# Patient Record
Sex: Male | Born: 1970
Health system: Southern US, Community
[De-identification: ages and names within clinical notes are randomized; demographics above are authoritative.]

## PROBLEM LIST (undated history)

## (undated) DIAGNOSIS — Z86010 Personal history of colonic polyps: Secondary | ICD-10-CM

## (undated) DIAGNOSIS — E781 Pure hyperglyceridemia: Secondary | ICD-10-CM

## (undated) DIAGNOSIS — E782 Mixed hyperlipidemia: Secondary | ICD-10-CM

## (undated) DIAGNOSIS — Z860101 Personal history of adenomatous and serrated colon polyps: Secondary | ICD-10-CM

## (undated) DIAGNOSIS — M109 Gout, unspecified: Secondary | ICD-10-CM

## (undated) DIAGNOSIS — E119 Type 2 diabetes mellitus without complications: Secondary | ICD-10-CM

## (undated) HISTORY — DX: Personal history of colonic polyps: Z86.010

## (undated) HISTORY — DX: Mixed hyperlipidemia: E78.2

## (undated) HISTORY — DX: Gout, unspecified: M10.9

## (undated) HISTORY — DX: Pure hyperglyceridemia: E78.1

## (undated) HISTORY — DX: Personal history of adenomatous and serrated colon polyps: Z86.0101

## (undated) HISTORY — PX: NO PAST SURGERIES: SHX2092

## (undated) HISTORY — DX: Type 2 diabetes mellitus without complications: E11.9

---

## 2012-05-25 DIAGNOSIS — E781 Pure hyperglyceridemia: Secondary | ICD-10-CM

## 2012-05-25 HISTORY — DX: Pure hyperglyceridemia: E78.1

## 2015-07-19 ENCOUNTER — Ambulatory Visit: Payer: Self-pay | Admitting: Family Medicine

## 2015-08-01 ENCOUNTER — Ambulatory Visit: Payer: Self-pay | Admitting: Family Medicine

## 2015-12-03 ENCOUNTER — Ambulatory Visit (HOSPITAL_BASED_OUTPATIENT_CLINIC_OR_DEPARTMENT_OTHER)
Admission: RE | Admit: 2015-12-03 | Discharge: 2015-12-03 | Disposition: A | Discharge status: Home / Self Care | Payer: BLUE CROSS/BLUE SHIELD | Source: Ambulatory Visit | Attending: Family Medicine | Admitting: Family Medicine

## 2015-12-03 ENCOUNTER — Ambulatory Visit (INDEPENDENT_AMBULATORY_CARE_PROVIDER_SITE_OTHER): Payer: BLUE CROSS/BLUE SHIELD | Admitting: Family Medicine

## 2015-12-03 ENCOUNTER — Encounter: Payer: Self-pay | Admitting: Family Medicine

## 2015-12-03 VITALS — BP 122/83 | HR 57 | Temp 98.2°F | Resp 16 | Ht 72.5 in | Wt 216.5 lb

## 2015-12-03 DIAGNOSIS — R109 Unspecified abdominal pain: Secondary | ICD-10-CM | POA: Diagnosis not present

## 2015-12-03 DIAGNOSIS — Z0000 Encounter for general adult medical examination without abnormal findings: Secondary | ICD-10-CM

## 2015-12-03 DIAGNOSIS — R1011 Right upper quadrant pain: Secondary | ICD-10-CM | POA: Insufficient documentation

## 2015-12-03 DIAGNOSIS — Z0001 Encounter for general adult medical examination with abnormal findings: Secondary | ICD-10-CM

## 2015-12-03 LAB — LIPID PANEL
CHOL/HDL RATIO: 7
CHOLESTEROL: 223 mg/dL — AB (ref 0–200)
HDL: 34.3 mg/dL — ABNORMAL LOW (ref >39.00)
LDL CALC: 150 mg/dL — AB (ref 0–99)
NonHDL: 188.92
TRIGLYCERIDES: 193 mg/dL — AB (ref 0.0–149.0)
VLDL: 38.6 mg/dL (ref 0.0–40.0)

## 2015-12-03 LAB — CBC WITH DIFFERENTIAL/PLATELET
BASOS ABS: 0 10*3/uL (ref 0.0–0.1)
BASOS PCT: 0.7 % (ref 0.0–3.0)
EOS ABS: 0.2 10*3/uL (ref 0.0–0.7)
Eosinophils Relative: 2.4 % (ref 0.0–5.0)
HEMATOCRIT: 44.8 % (ref 39.0–52.0)
HEMOGLOBIN: 14.5 g/dL (ref 13.0–17.0)
Lymphocytes Relative: 37.2 % (ref 12.0–46.0)
Lymphs Abs: 2.4 10*3/uL (ref 0.7–4.0)
MCHC: 32.3 g/dL (ref 30.0–36.0)
MCV: 79.7 fl (ref 78.0–100.0)
MONOS PCT: 10.1 % (ref 3.0–12.0)
Monocytes Absolute: 0.7 10*3/uL (ref 0.1–1.0)
NEUTROS ABS: 3.2 10*3/uL (ref 1.4–7.7)
Neutrophils Relative %: 49.6 % (ref 43.0–77.0)
PLATELETS: 196 10*3/uL (ref 150.0–400.0)
RBC: 5.62 Mil/uL (ref 4.22–5.81)
RDW: 13.7 % (ref 11.5–15.5)
WBC: 6.5 10*3/uL (ref 4.0–10.5)

## 2015-12-03 LAB — COMPREHENSIVE METABOLIC PANEL
ALBUMIN: 4.2 g/dL (ref 3.5–5.2)
ALT: 25 U/L (ref 0–53)
AST: 21 U/L (ref 0–37)
Alkaline Phosphatase: 54 U/L (ref 39–117)
BUN: 18 mg/dL (ref 6–23)
CALCIUM: 9.4 mg/dL (ref 8.4–10.5)
CHLORIDE: 104 meq/L (ref 96–112)
CO2: 30 meq/L (ref 19–32)
Creatinine, Ser: 1.12 mg/dL (ref 0.40–1.50)
GFR: 75.75 mL/min (ref >60.00)
Glucose, Bld: 85 mg/dL (ref 70–99)
POTASSIUM: 4.3 meq/L (ref 3.5–5.1)
Sodium: 139 mEq/L (ref 135–145)
Total Bilirubin: 0.6 mg/dL (ref 0.2–1.2)
Total Protein: 7.4 g/dL (ref 6.0–8.3)

## 2015-12-03 LAB — TSH: TSH: 2.42 u[IU]/mL (ref 0.35–4.50)

## 2015-12-03 NOTE — Progress Notes (Signed)
Pre visit review using our clinic review tool, if applicable. No additional management support is needed unless otherwise documented below in the visit note. 

## 2015-12-03 NOTE — Addendum Note (Signed)
Addended by: Onalee Hua on: 12/03/2015 09:41 AM   Modules accepted: Miquel Dunn

## 2015-12-03 NOTE — Progress Notes (Signed)
Office Note 12/03/2015  CC:  Chief Complaint  Patient presents with  . Establish Care  . Annual Exam    Pt is fasting.   . Abdominal Pain    URQ, starts  after he eats x 2 weeks    HPI:  Austin Sanchez is a 45 y.o. male who is here today to establish care. Patient's most recent primary MD: none locally. Old records were (health panel labs from 2014, all normal) reviewed prior to or during today's visit.  Wants to get physical today and talk about abdominal pain.   Acute complaint:  When he eats he starts to get some RUQ pain that radiates around R side to mid back.  Onset was 2-3 weeks ago, happens each time he eats and seems to be getting slightly more severe.  It passes in about 10 min.  No nausea.  No fever.  Happens even with water.  It is not any worse with a fatty meal. He has not had this evaluated by anyone yet.  No meds have been tried.   History reviewed. No pertinent past medical history. No significant PMH  History reviewed. No pertinent past surgical history. No signif PSH  Family History  Problem Relation Age of Onset  . Diabetes Mother   . Heart disease Mother   . Stroke Mother   . Diabetes Father   . Cancer Neg Hx     Social History   Social History  . Marital Status: Married    Spouse Name: N/A  . Number of Children: N/A  . Years of Education: N/A   Occupational History  . Not on file.   Social History Main Topics  . Smoking status: Never Smoker   . Smokeless tobacco: Never Used  . Alcohol Use: Yes     Comment: twice a month 1 glass of wine  . Drug Use: No  . Sexual Activity: Not on file   Other Topics Concern  . Not on file   Social History Narrative   Married, male partner.  Three children.   Educ: PhD: [employer].  Relocated from [facility] 2015.   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  Occasional glass of wine.    MEDS: none except occas CoQ10  No Known  Allergies  ROS Review of Systems  Constitutional: Negative for fever, chills, appetite change and fatigue.  HENT: Negative for congestion, dental problem, ear pain and sore throat.   Eyes: Negative for discharge, redness and visual disturbance.  Respiratory: Negative for cough, chest tightness, shortness of breath and wheezing.   Cardiovascular: Negative for chest pain, palpitations and leg swelling.  Gastrointestinal: Positive for abdominal pain. Negative for nausea, vomiting, diarrhea and blood in stool.  Genitourinary: Negative for dysuria, urgency, frequency, hematuria, flank pain and difficulty urinating.  Musculoskeletal: Positive for arthralgias (wrists). Negative for myalgias, back pain, joint swelling and neck stiffness.  Skin: Negative for pallor and rash.  Neurological: Negative for dizziness, speech difficulty, weakness and headaches.  Hematological: Negative for adenopathy. Does not bruise/bleed easily.  Psychiatric/Behavioral: Negative for confusion and sleep disturbance. The patient is not nervous/anxious.     PE; Blood pressure 122/83, pulse 57, temperature 98.2 F (36.8 C), temperature source Oral, resp. rate 16, height 6' 0.5" (1.842 m), weight 216 lb 8 oz (98.204 kg), SpO2 97 %. Body mass index is 28.94 kg/(m^2).  Gen: Alert, well appearing.  Patient is oriented to person, place, time, and situation. AFFECT: pleasant, lucid thought and  speech. ENT: Ears: EACs clear, normal epithelium.  TMs with good light reflex and landmarks bilaterally.  Eyes: no injection, icteris, swelling, or exudate.  EOMI, PERRLA. Nose: no drainage or turbinate edema/swelling.  No injection or focal lesion.  Mouth: lips without lesion/swelling.  Oral mucosa pink and moist.  Dentition intact and without obvious caries or gingival swelling.  Oropharynx without erythema, exudate, or swelling.  Neck: supple/nontender.  No LAD, mass, or TM.  Carotid pulses 2+ bilaterally, without bruits. CV: RRR, no  m/r/g.   LUNGS: CTA bilat, nonlabored resps, good aeration in all lung fields. ABD: soft, NT except for in RUQ--mild, without guarding or rebound, ND, BS normal.  No hepatospenomegaly or mass.  No bruits. EXT: no clubbing, cyanosis, or edema.  Musculoskeletal: no joint swelling, erythema, warmth, or tenderness.  ROM of all joints intact. Skin - no sores or suspicious lesions or rashes or color changes  Pertinent labs:  None today  ASSESSMENT AND PLAN:   New pt: will try to obtain old records.  1) Recurrent RUQ postprandial abd pain (onset 2-3 wks ago): need to r/o symptomatic gallstones. Complete abd u/s ordered. CBC, CMET to be done today.  2) Health maintenance exam:  Reviewed age and gender appropriate health maintenance issues (prudent diet, regular exercise, health risks of tobacco and excessive alcohol, use of seatbelts, fire alarms in home, use of sunscreen).  Also reviewed age and gender appropriate health screening as well as vaccine recommendations. Fasting HP labs drawn today, plus a screening HIV test.  An After Visit Summary was printed and given to the patient.  Return for follow up to be determined based on results of work up.  Signed:  Crissie Sickles, MD           12/03/2015

## 2015-12-04 ENCOUNTER — Other Ambulatory Visit: Payer: Self-pay | Admitting: Family Medicine

## 2015-12-04 DIAGNOSIS — R1011 Right upper quadrant pain: Secondary | ICD-10-CM

## 2015-12-04 LAB — HIV ANTIBODY (ROUTINE TESTING W REFLEX): HIV 1&2 Ab, 4th Generation: NONREACTIVE

## 2016-02-04 ENCOUNTER — Encounter: Payer: Self-pay | Admitting: Family Medicine

## 2016-03-09 ENCOUNTER — Encounter: Payer: Self-pay | Admitting: Family Medicine

## 2016-07-09 DIAGNOSIS — L308 Other specified dermatitis: Secondary | ICD-10-CM | POA: Diagnosis not present

## 2017-01-16 DIAGNOSIS — L03119 Cellulitis of unspecified part of limb: Secondary | ICD-10-CM | POA: Diagnosis not present

## 2017-01-16 DIAGNOSIS — M25572 Pain in left ankle and joints of left foot: Secondary | ICD-10-CM | POA: Diagnosis not present

## 2017-06-01 DIAGNOSIS — M79674 Pain in right toe(s): Secondary | ICD-10-CM | POA: Diagnosis not present

## 2017-06-12 IMAGING — US US ABDOMEN COMPLETE
1 series · 14 of 25 positions shown · non-contrast
Comparison: None.

CLINICAL DATA: Postprandial left upper quadrant and right upper
quadrant pain.

EXAM:
ABDOMEN ULTRASOUND COMPLETE

[Series 1: us abdomen complete · 0.17mm/px · 14 of 61 slices shown]
[im 1/61]
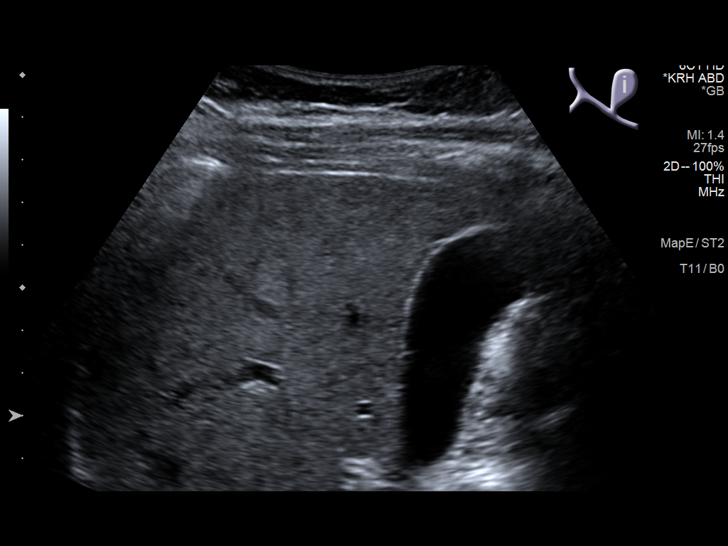
[im 6/61]
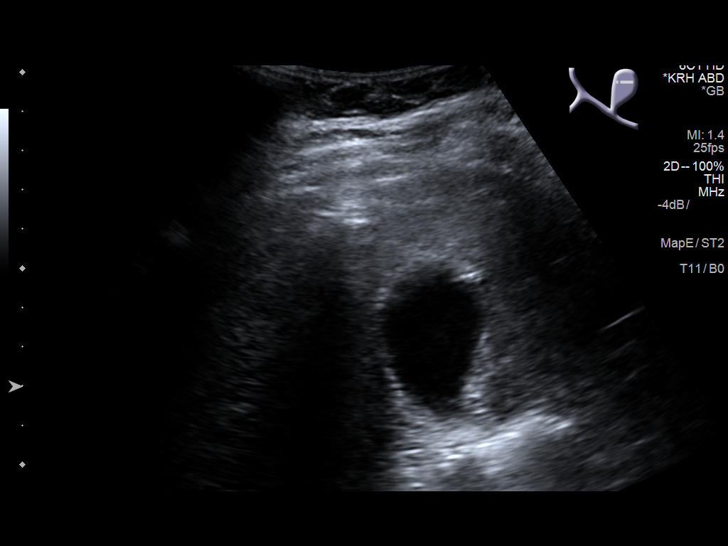
[im 11/61]
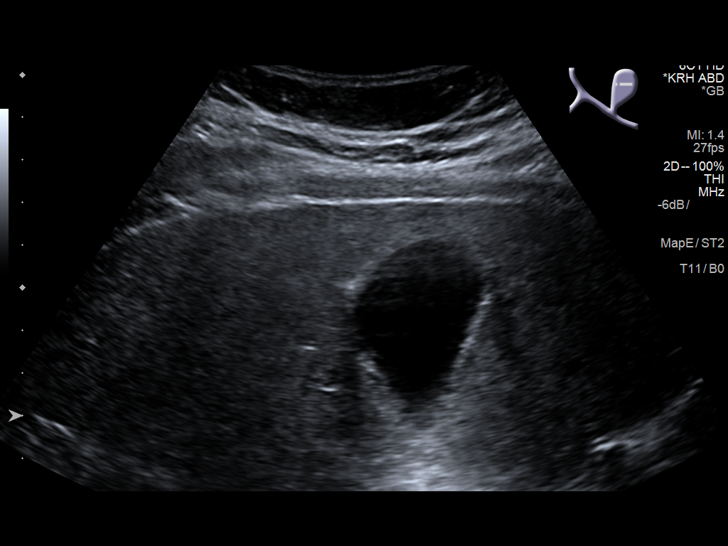
[im 16/61]
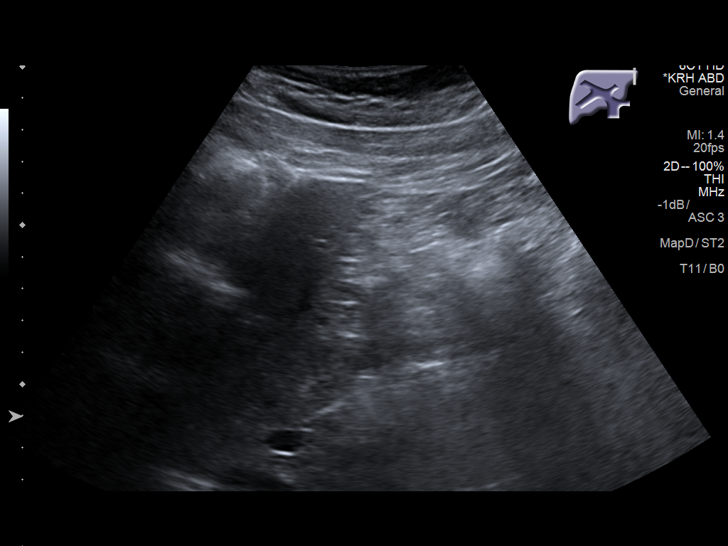
[im 21/61]
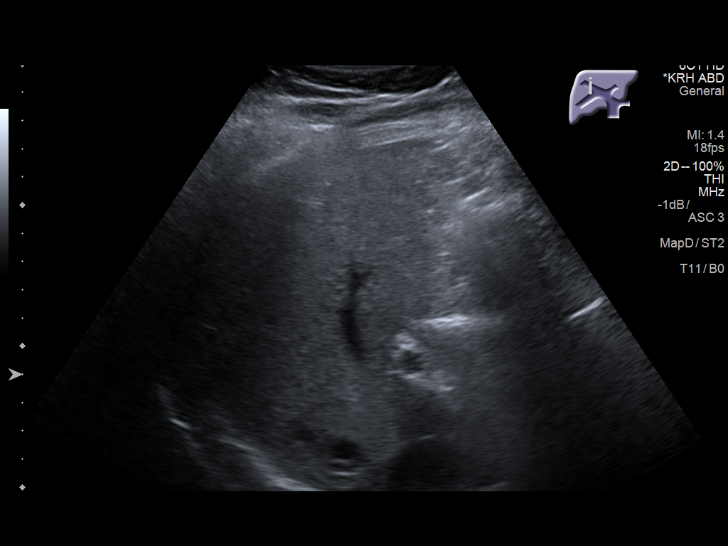
[im 23/61]
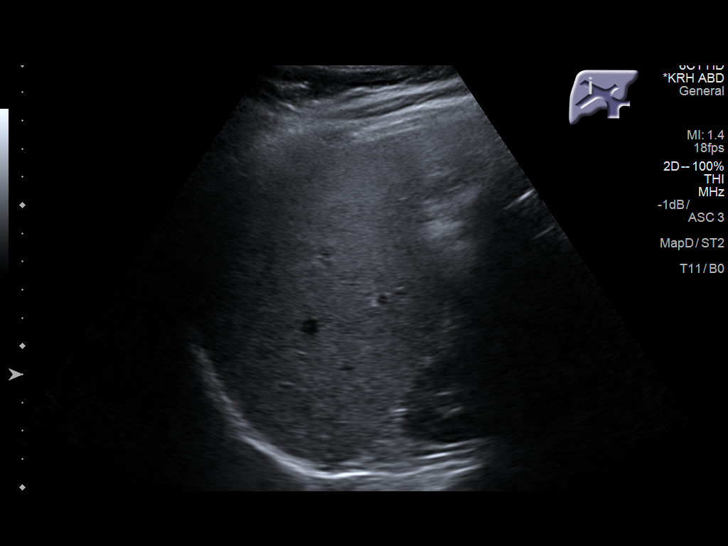
[im 28/61]
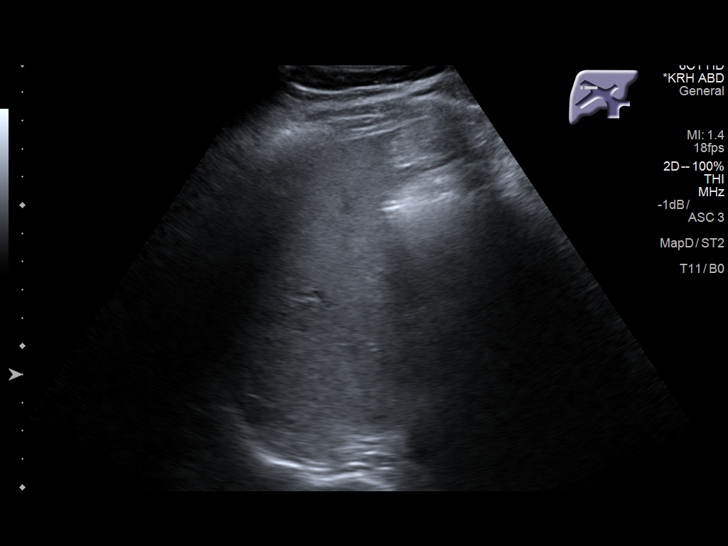
[im 33/61]
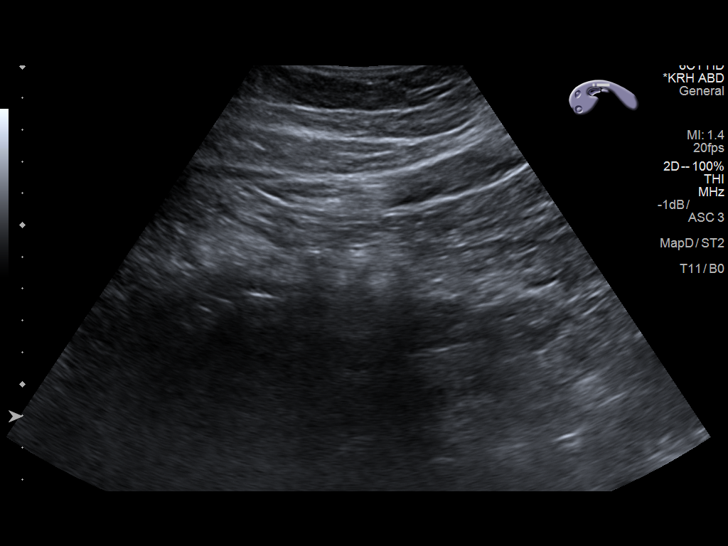
[im 38/61]
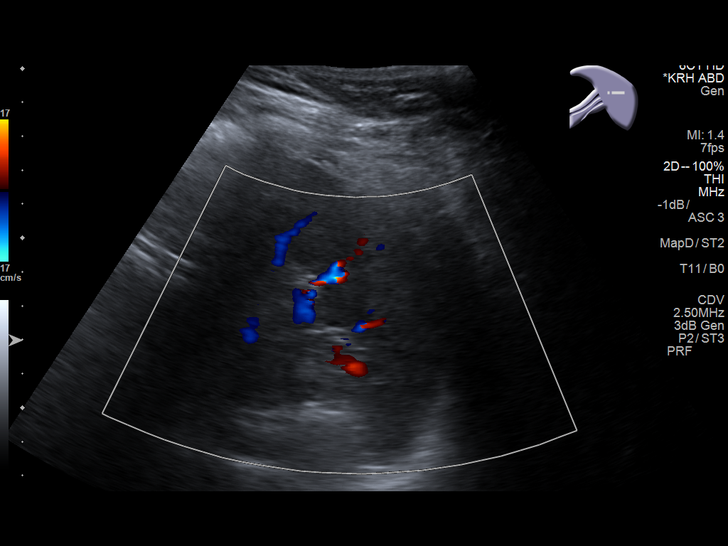
[im 41/61]
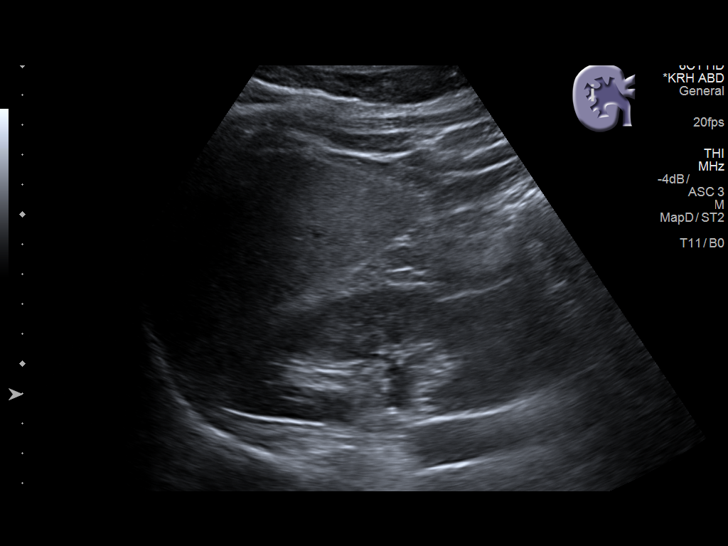
[im 46/61]
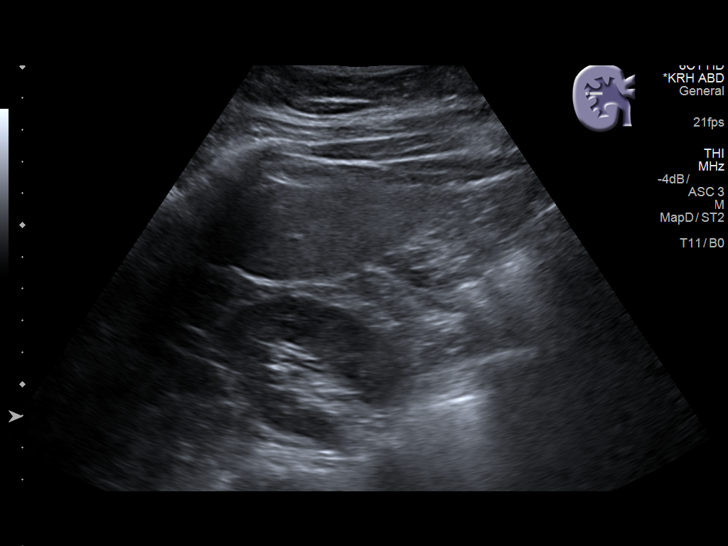
[im 51/61]
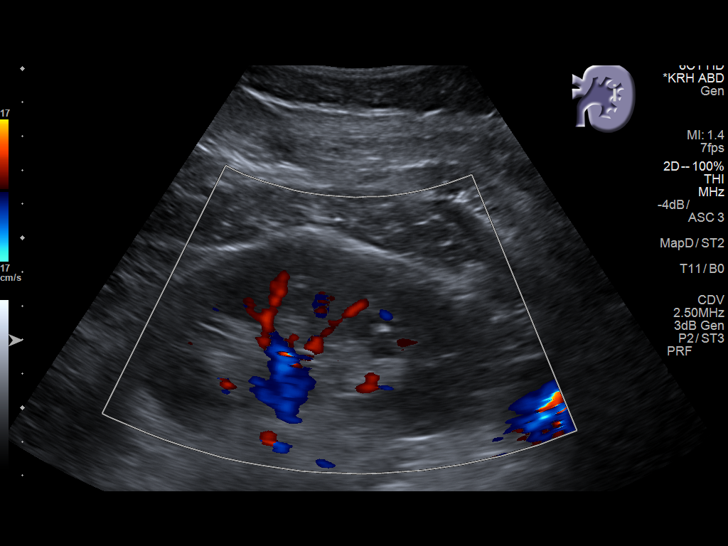
[im 56/61]
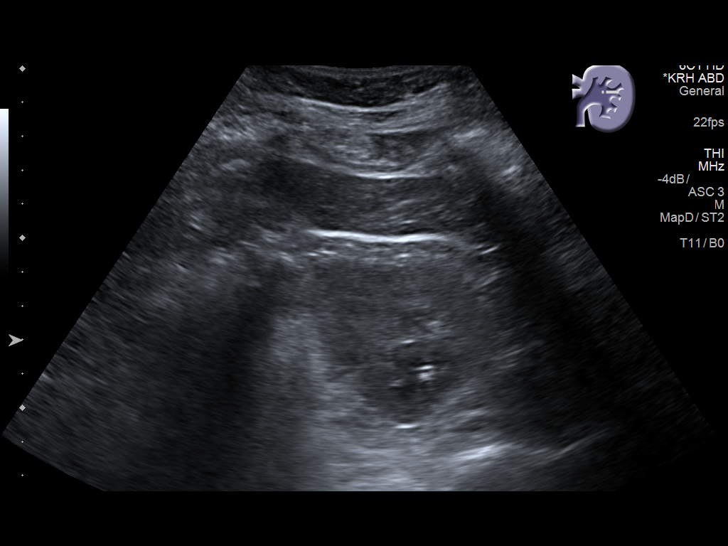
[im 61/61]
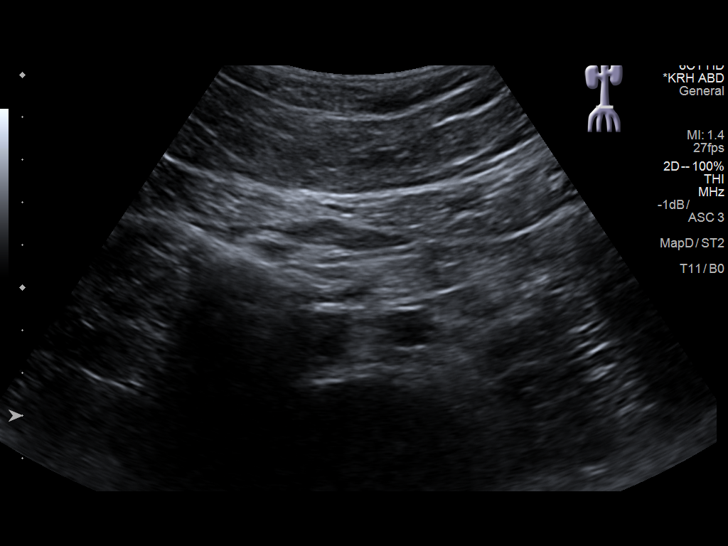

[14 of 25 positions shown; findings below may reference images not displayed]

FINDINGS: Gallbladder: No gallstones or wall thickening visualized. No
sonographic Murphy sign noted by sonographer.

Common bile duct: Diameter: Normal caliber, 2 mm

Liver: No focal lesion identified. Within normal limits in
parenchymal echogenicity.

IVC: No abnormality visualized.

Pancreas: Visualized portion unremarkable.

Spleen: Size and appearance within normal limits.

Right Kidney: Length: 10.7 cm. Echogenicity within normal limits. No
mass or hydronephrosis visualized.

Left Kidney: Length: 11.2 cm. Echogenicity within normal limits. No
mass or hydronephrosis visualized.

Abdominal aorta: No aneurysm visualized.

Other findings: None.
IMPRESSION: Unremarkable abdominal ultrasound.

## 2017-07-30 ENCOUNTER — Encounter: Payer: BLUE CROSS/BLUE SHIELD | Admitting: Family Medicine

## 2017-08-17 ENCOUNTER — Ambulatory Visit (INDEPENDENT_AMBULATORY_CARE_PROVIDER_SITE_OTHER): Payer: BLUE CROSS/BLUE SHIELD | Admitting: Family Medicine

## 2017-08-17 ENCOUNTER — Encounter: Payer: BLUE CROSS/BLUE SHIELD | Admitting: Family Medicine

## 2017-08-17 ENCOUNTER — Encounter: Payer: Self-pay | Admitting: Family Medicine

## 2017-08-17 VITALS — BP 136/87 | HR 63 | Temp 98.0°F | Resp 16 | Ht 72.0 in | Wt 220.0 lb

## 2017-08-17 DIAGNOSIS — Z0000 Encounter for general adult medical examination without abnormal findings: Secondary | ICD-10-CM

## 2017-08-17 DIAGNOSIS — Z23 Encounter for immunization: Secondary | ICD-10-CM | POA: Diagnosis not present

## 2017-08-17 DIAGNOSIS — K219 Gastro-esophageal reflux disease without esophagitis: Secondary | ICD-10-CM

## 2017-08-17 LAB — LIPID PANEL
CHOLESTEROL: 213 mg/dL — AB (ref 0–200)
HDL: 38.3 mg/dL — AB (ref >39.00)
LDL Cholesterol: 142 mg/dL — ABNORMAL HIGH (ref 0–99)
NonHDL: 174.65
TRIGLYCERIDES: 162 mg/dL — AB (ref 0.0–149.0)
Total CHOL/HDL Ratio: 6
VLDL: 32.4 mg/dL (ref 0.0–40.0)

## 2017-08-17 LAB — CBC WITH DIFFERENTIAL/PLATELET
BASOS ABS: 0 10*3/uL (ref 0.0–0.1)
Basophils Relative: 0.6 % (ref 0.0–3.0)
EOS ABS: 0.2 10*3/uL (ref 0.0–0.7)
Eosinophils Relative: 3.5 % (ref 0.0–5.0)
HCT: 44.4 % (ref 39.0–52.0)
HEMOGLOBIN: 14.4 g/dL (ref 13.0–17.0)
LYMPHS PCT: 28.3 % (ref 12.0–46.0)
Lymphs Abs: 1.7 10*3/uL (ref 0.7–4.0)
MCHC: 32.4 g/dL (ref 30.0–36.0)
MCV: 81.4 fl (ref 78.0–100.0)
Monocytes Absolute: 0.6 10*3/uL (ref 0.1–1.0)
Monocytes Relative: 9.9 % (ref 3.0–12.0)
Neutro Abs: 3.5 10*3/uL (ref 1.4–7.7)
Neutrophils Relative %: 57.7 % (ref 43.0–77.0)
Platelets: 206 10*3/uL (ref 150.0–400.0)
RBC: 5.45 Mil/uL (ref 4.22–5.81)
RDW: 13.8 % (ref 11.5–15.5)
WBC: 6.1 10*3/uL (ref 4.0–10.5)

## 2017-08-17 LAB — COMPREHENSIVE METABOLIC PANEL
ALBUMIN: 4.2 g/dL (ref 3.5–5.2)
ALT: 42 U/L (ref 0–53)
AST: 43 U/L — AB (ref 0–37)
Alkaline Phosphatase: 56 U/L (ref 39–117)
BUN: 18 mg/dL (ref 6–23)
CALCIUM: 9.3 mg/dL (ref 8.4–10.5)
CHLORIDE: 102 meq/L (ref 96–112)
CO2: 30 mEq/L (ref 19–32)
CREATININE: 1.04 mg/dL (ref 0.40–1.50)
GFR: 81.87 mL/min (ref >60.00)
Glucose, Bld: 98 mg/dL (ref 70–99)
POTASSIUM: 4.4 meq/L (ref 3.5–5.1)
Sodium: 137 mEq/L (ref 135–145)
Total Bilirubin: 0.6 mg/dL (ref 0.2–1.2)
Total Protein: 7.2 g/dL (ref 6.0–8.3)

## 2017-08-17 LAB — TSH: TSH: 2.53 u[IU]/mL (ref 0.35–4.50)

## 2017-08-17 NOTE — Patient Instructions (Signed)
Buy otc generic zantac (ranitidine) 150mg  and take 1 every night about 1 hour before sleep. Also, take 2 sprays of saline nasal spray in each nostril every night before bed.   Gastroesophageal Reflux Disease, Adult Normally, food travels down the esophagus and stays in the stomach to be digested. However, when a person has gastroesophageal reflux disease (GERD), food and stomach acid move back up into the esophagus. When this happens, the esophagus becomes sore and inflamed. Over time, GERD can create small holes (ulcers) in the lining of the esophagus. What are the causes? This condition is caused by a problem with the muscle between the esophagus and the stomach (lower esophageal sphincter, or LES). Normally, the LES muscle closes after food passes through the esophagus to the stomach. When the LES is weakened or abnormal, it does not close properly, and that allows food and stomach acid to go back up into the esophagus. The LES can be weakened by certain dietary substances, medicines, and medical conditions, including:  Tobacco use.  Pregnancy.  Having a hiatal hernia.  Heavy alcohol use.  Certain foods and beverages, such as coffee, chocolate, onions, and peppermint.  What increases the risk? This condition is more likely to develop in:  People who have an increased body weight.  People who have connective tissue disorders.  People who use NSAID medicines.  What are the signs or symptoms? Symptoms of this condition include:  Heartburn.  Difficult or painful swallowing.  The feeling of having a lump in the throat.  Abitter taste in the mouth.  Bad breath.  Having a large amount of saliva.  Having an upset or bloated stomach.  Belching.  Chest pain.  Shortness of breath or wheezing.  Ongoing (chronic) cough or a night-time cough.  Wearing away of tooth enamel.  Weight loss.  Different conditions can cause chest pain. Make sure to see your health care  provider if you experience chest pain. How is this diagnosed? Your health care provider will take a medical history and perform a physical exam. To determine if you have mild or severe GERD, your health care provider may also monitor how you respond to treatment. You may also have other tests, including:  An endoscopy toexamine your stomach and esophagus with a small camera.  A test thatmeasures the acidity level in your esophagus.  A test thatmeasures how much pressure is on your esophagus.  A barium swallow or modified barium swallow to show the shape, size, and functioning of your esophagus.  How is this treated? The goal of treatment is to help relieve your symptoms and to prevent complications. Treatment for this condition may vary depending on how severe your symptoms are. Your health care provider may recommend:  Changes to your diet.  Medicine.  Surgery.  Follow these instructions at home: Diet  Follow a diet as recommended by your health care provider. This may involve avoiding foods and drinks such as: ? Coffee and tea (with or without caffeine). ? Drinks that containalcohol. ? Energy drinks and sports drinks. ? Carbonated drinks or sodas. ? Chocolate and cocoa. ? Peppermint and mint flavorings. ? Garlic and onions. ? Horseradish. ? Spicy and acidic foods, including peppers, chili powder, curry powder, vinegar, hot sauces, and barbecue sauce. ? Citrus fruit juices and citrus fruits, such as oranges, lemons, and limes. ? Tomato-based foods, such as red sauce, chili, salsa, and pizza with red sauce. ? Fried and fatty foods, such as donuts, french fries, potato chips, and high-fat  dressings. ? High-fat meats, such as hot dogs and fatty cuts of red and white meats, such as rib eye steak, sausage, ham, and bacon. ? High-fat dairy items, such as whole milk, butter, and cream cheese.  Eat small, frequent meals instead of large meals.  Avoid drinking large amounts of  liquid with your meals.  Avoid eating meals during the 2-3 hours before bedtime.  Avoid lying down right after you eat.  Do not exercise right after you eat. General instructions  Pay attention to any changes in your symptoms.  Take over-the-counter and prescription medicines only as told by your health care provider. Do not take aspirin, ibuprofen, or other NSAIDs unless your health care provider told you to do so.  Do not use any tobacco products, including cigarettes, chewing tobacco, and e-cigarettes. If you need help quitting, ask your health care provider.  Wear loose-fitting clothing. Do not wear anything tight around your waist that causes pressure on your abdomen.  Raise (elevate) the head of your bed 6 inches (15cm).  Try to reduce your stress, such as with yoga or meditation. If you need help reducing stress, ask your health care provider.  If you are overweight, reduce your weight to an amount that is healthy for you. Ask your health care provider for guidance about a safe weight loss goal.  Keep all follow-up visits as told by your health care provider. This is important. Contact a health care provider if:  You have new symptoms.  You have unexplained weight loss.  You have difficulty swallowing, or it hurts to swallow.  You have wheezing or a persistent cough.  Your symptoms do not improve with treatment.  You have a hoarse voice. Get help right away if:  You have pain in your arms, neck, jaw, teeth, or back.  You feel sweaty, dizzy, or light-headed.  You have chest pain or shortness of breath.  You vomit and your vomit looks like blood or coffee grounds.  You faint.  Your stool is bloody or black.  You cannot swallow, drink, or eat. This information is not intended to replace advice given to you by your health care provider. Make sure you discuss any questions you have with your health care provider. Document Released: 02/18/2005 Document Revised:  10/09/2015 Document Reviewed: 09/05/2014 Elsevier Interactive Patient Education  [employer].

## 2017-08-17 NOTE — Addendum Note (Signed)
Addended by: Gordy Councilman on: 08/17/2017 09:17 AM   Modules accepted: Orders

## 2017-08-17 NOTE — Progress Notes (Signed)
Office Note 08/17/2017  CC:  Chief Complaint  Patient presents with  . Annual Exam    HPI:  Austin Sanchez is a 47 y.o. male who is here for annual health maintenance exam.  (at last CPE in 2017, he complained of postprandial RUQ pain.  Abd u/s neg at that time.  Referred pt to GI--no evidence in chart that he ever went to GI.).  Exercise: 2-3 days per week, CV and light wts. Diet: has cut back on rice some, increasing veggies and fish and chicken--avoids beef.    For the last 1 mo he has noted a sense of apnea during sleep--describes this as fullness or narrowed sensation in throat, woke up during sleep and the sensation passes quickly.  Has burping and sometimes a sensation of something rising from stomach up esophagus and to back of throat..  Some throat clearing.  No cough, ST, or chest burning.   Turning from back to side helps.  He snores sometimes per wife, but she has not noted any apneic events. Also has chronic nasal congestion, mostly L side--with deviated septum. No excessive daytime sleepiness.   Past Medical History:  Diagnosis Date  . Hypertriglyceridemia 2014    History reviewed. No pertinent surgical history.  Family History  Problem Relation Age of Onset  . Diabetes Mother   . Heart disease Mother   . Stroke Mother   . Diabetes Father   . Stroke Maternal Grandfather   . Cancer Neg Hx     Social History   Socioeconomic History  . Marital status: Married    Spouse name: Not on file  . Number of children: Not on file  . Years of education: Not on file  . Highest education level: Not on file  Occupational History  . Not on file  Social Needs  . Financial resource strain: Not on file  . Food insecurity:    Worry: Not on file    Inability: Not on file  . Transportation needs:    Medical: Not on file    Non-medical: Not on file  Tobacco Use  . Smoking status: Never Smoker  . Smokeless tobacco: Never Used  Substance and Sexual Activity  .  Alcohol use: Yes    Comment: twice a month 1 glass of wine  . Drug use: No  . Sexual activity: Not on file  Lifestyle  . Physical activity:    Days per week: Not on file    Minutes per session: Not on file  . Stress: Not on file  Relationships  . Social connections:    Talks on phone: Not on file    Gets together: Not on file    Attends religious service: Not on file    Active member of club or organization: Not on file    Attends meetings of clubs or organizations: Not on file    Relationship status: Not on file  . Intimate partner violence:    Fear of current or ex partner: Not on file    Emotionally abused: Not on file    Physically abused: Not on file    Forced sexual activity: Not on file  Other Topics Concern  . Not on file  Social History Narrative   Married, male partner.  Three children.   Educ: PhD: [employer].  Relocated from [facility] 2015.   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  Occasional glass of wine.    Outpatient Medications Prior to Visit  Medication Sig Dispense Refill  . Multiple Vitamin (MULTIVITAMIN) tablet Take 1 tablet by mouth daily.     No facility-administered medications prior to visit.     No Known Allergies  ROS Review of Systems  Constitutional: Negative for appetite change, chills, fatigue and fever.  HENT: Negative for congestion, dental problem, ear pain and sore throat.   Eyes: Negative for discharge, redness and visual disturbance.  Respiratory: Negative for cough, chest tightness, shortness of breath and wheezing.   Cardiovascular: Negative for chest pain, palpitations and leg swelling.  Gastrointestinal: Negative for abdominal pain, blood in stool, diarrhea, nausea and vomiting.  Genitourinary: Negative for difficulty urinating, dysuria, flank pain, frequency, hematuria and urgency.  Musculoskeletal: Negative for arthralgias, back pain, joint swelling, myalgias and neck stiffness.   Skin: Negative for pallor and rash.  Neurological: Negative for dizziness, speech difficulty, weakness and headaches.  Hematological: Negative for adenopathy. Does not bruise/bleed easily.  Psychiatric/Behavioral: Negative for confusion and sleep disturbance. The patient is not nervous/anxious.     PE; Blood pressure 136/87, pulse 63, temperature 98 F (36.7 C), temperature source Oral, resp. rate 16, height 6' (1.829 m), weight 220 lb (99.8 kg), SpO2 100 %. Body mass index is 29.84 kg/m.  Gen: Alert, well appearing.  Patient is oriented to person, place, time, and situation. AFFECT: pleasant, lucid thought and speech. ENT: Ears: EACs clear, normal epithelium.  TMs with good light reflex and landmarks bilaterally.  Eyes: no injection, icteris, swelling, or exudate.  EOMI, PERRLA. Nose: no drainage.  Mild mucosal edema and some dried mucous in L nostril, congestion in L nostril with .  No injection or focal lesion.  Nasal septum deviated a bit to L  Mouth: lips without lesion/swelling.  Oral mucosa pink and moist.  Dentition intact and without obvious caries or gingival swelling.  Oropharynx without erythema, exudate, or swelling.  Neck: supple/nontender.  No LAD, mass, or TM.  Carotid pulses 2+ bilaterally, without bruits. CV: RRR, no m/r/g.   LUNGS: CTA bilat, nonlabored resps, good aeration in all lung fields. ABD: soft, NT, ND, BS normal.  No hepatospenomegaly or mass.  No bruits. EXT: no clubbing, cyanosis, or edema.  Musculoskeletal: no joint swelling, erythema, warmth, or tenderness.  ROM of all joints intact. Skin - no sores or suspicious lesions or rashes or color changes.  He has a macule on his L upper back that has been there since childhood and per pt has grown only in proportion to his body growth.  It is diamond shaped, has distinct borders, measures 6 mm horizontal axis and 7 mm vertical axis.  Dark brown color---macule.   Pertinent labs:  Lab Results  Component Value Date    TSH 2.42 12/03/2015   Lab Results  Component Value Date   WBC 6.5 12/03/2015   HGB 14.5 12/03/2015   HCT 44.8 12/03/2015   MCV 79.7 12/03/2015   PLT 196.0 12/03/2015   Lab Results  Component Value Date   CREATININE 1.12 12/03/2015   BUN 18 12/03/2015   NA 139 12/03/2015   K 4.3 12/03/2015   CL 104 12/03/2015   CO2 30 12/03/2015   Lab Results  Component Value Date   ALT 25 12/03/2015   AST 21 12/03/2015   ALKPHOS 54 12/03/2015   BILITOT 0.6 12/03/2015   Lab Results  Component Value Date   CHOL 223 (H) 12/03/2015   Lab Results  Component Value Date   HDL 34.30 (L) 12/03/2015   Lab Results  Component  Value Date   LDLCALC 150 (H) 12/03/2015   Lab Results  Component Value Date   TRIG 193.0 (H) 12/03/2015   Lab Results  Component Value Date   CHOLHDL 7 12/03/2015     ASSESSMENT AND PLAN:   1) LPR: I think this is giving him his sensation of throat fullness and awakening while sleeping. I also think his deviated septum and L nostril congestion may be contributing, at least to his snoring. This does not sound like OSA. Instructions: Buy otc generic zantac (ranitidine) 150mg  and take 1 every night about 1 hour before sleep. GERD info handout reviewed and given to pt. Also, take 2 sprays of saline nasal spray in each nostril every night before bed.  2) Health maintenance exam: Reviewed age and gender appropriate health maintenance issues (prudent diet, regular exercise, health risks of tobacco and excessive alcohol, use of seatbelts, fire alarms in home, use of sunscreen).  Also reviewed age and gender appropriate health screening as well as vaccine recommendations. Vaccines: Tdap--given today.   Flu vaccine--UTD. Labs: fasting HP Prostate ca screening:  Average risk pt= start screening at age 52 yrs. Colon ca screening: Average risk pt= start screening at age 11 yrs.  An After Visit Summary was printed and given to the patient.  FOLLOW UP:  Return for  annual CPE (fasting).  Signed:  Crissie Sickles, MD           08/17/2017

## 2017-08-18 ENCOUNTER — Encounter: Payer: Self-pay | Admitting: *Deleted

## 2017-10-15 ENCOUNTER — Ambulatory Visit: Payer: BLUE CROSS/BLUE SHIELD | Admitting: Family Medicine

## 2017-10-15 ENCOUNTER — Encounter: Payer: Self-pay | Admitting: Family Medicine

## 2017-10-15 VITALS — BP 129/79 | HR 61 | Temp 97.9°F | Resp 16 | Ht 72.0 in | Wt 228.0 lb

## 2017-10-15 DIAGNOSIS — E669 Obesity, unspecified: Secondary | ICD-10-CM | POA: Diagnosis not present

## 2017-10-15 DIAGNOSIS — M549 Dorsalgia, unspecified: Secondary | ICD-10-CM

## 2017-10-15 DIAGNOSIS — S63501A Unspecified sprain of right wrist, initial encounter: Secondary | ICD-10-CM | POA: Diagnosis not present

## 2017-10-15 MED ORDER — MELOXICAM 15 MG PO TABS: ORAL_TABLET | ORAL | 0 refills | Status: DC

## 2017-10-15 NOTE — Patient Instructions (Signed)
Low Back Sprain Rehab  Ask your health care provider which exercises are safe for you. Do exercises exactly as told by your health care provider and adjust them as directed. It is normal to feel mild stretching, pulling, tightness, or discomfort as you do these exercises, but you should stop right away if you feel sudden pain or your pain gets worse. Do not begin these exercises until told by your health care provider.  Stretching and range of motion exercises  These exercises warm up your muscles and joints and improve the movement and flexibility of your back. These exercises also help to relieve pain, numbness, and tingling.  Exercise A: Lumbar rotation    1. Lie on your back on a firm surface and bend your knees.  2. Straighten your arms out to your sides so each arm forms an "L" shape with a side of your body (a 90 degree angle).  3. Slowly move both of your knees to one side of your body until you feel a stretch in your lower back. Try not to let your shoulders move off of the floor.  4. Hold for __________ seconds.  5. Tense your abdominal muscles and slowly move your knees back to the starting position.  6. Repeat this exercise on the other side of your body.  Repeat __________ times. Complete this exercise __________ times a day.  Exercise B: Prone extension on elbows    1. Lie on your abdomen on a firm surface.  2. Prop yourself up on your elbows.  3. Use your arms to help lift your chest up until you feel a gentle stretch in your abdomen and your lower back.  ? This will place some of your body weight on your elbows. If this is uncomfortable, try stacking pillows under your chest.  ? Your hips should stay down, against the surface that you are lying on. Keep your hip and back muscles relaxed.  4. Hold for __________ seconds.  5. Slowly relax your upper body and return to the starting position.  Repeat __________ times. Complete this exercise __________ times a day.  Strengthening exercises  These  exercises build strength and endurance in your back. Endurance is the ability to use your muscles for a long time, even after they get tired.  Exercise C: Pelvic tilt  1. Lie on your back on a firm surface. Bend your knees and keep your feet flat.  2. Tense your abdominal muscles. Tip your pelvis up toward the ceiling and flatten your lower back into the floor.  ? To help with this exercise, you may place a small towel under your lower back and try to push your back into the towel.  3. Hold for __________ seconds.  4. Let your muscles relax completely before you repeat this exercise.  Repeat __________ times. Complete this exercise __________ times a day.  Exercise D: Alternating arm and leg raises    1. Get on your hands and knees on a firm surface. If you are on a hard floor, you may want to use padding to cushion your knees, such as an exercise mat.  2. Line up your arms and legs. Your hands should be below your shoulders, and your knees should be below your hips.  3. Lift your left leg behind you. At the same time, raise your right arm and straighten it in front of you.  ? Do not lift your leg higher than your hip.  ? Do not lift your arm   higher than your shoulder.  ? Keep your abdominal and back muscles tight.  ? Keep your hips facing the ground.  ? Do not arch your back.  ? Keep your balance carefully, and do not hold your breath.  4. Hold for __________ seconds.  5. Slowly return to the starting position and repeat with your right leg and your left arm.  Repeat __________ times. Complete this exercise __________ times a day.  Exercise E: Abdominal set with straight leg raise    1. Lie on your back on a firm surface.  2. Bend one of your knees and keep your other leg straight.  3. Tense your abdominal muscles and lift your straight leg up, 4-6 inches (10-15 cm) off the ground.  4. Keep your abdominal muscles tight and hold for __________ seconds.  ? Do not hold your breath.  ? Do not arch your back. Keep it  flat against the ground.  5. Keep your abdominal muscles tense as you slowly lower your leg back to the starting position.  6. Repeat with your other leg.  Repeat __________ times. Complete this exercise __________ times a day.  Posture and body mechanics    Body mechanics refers to the movements and positions of your body while you do your daily activities. Posture is part of body mechanics. Good posture and healthy body mechanics can help to relieve stress in your body's tissues and joints. Good posture means that your spine is in its natural S-curve position (your spine is neutral), your shoulders are pulled back slightly, and your head is not tipped forward. The following are general guidelines for applying improved posture and body mechanics to your everyday activities.  Standing    · When standing, keep your spine neutral and your feet about hip-width apart. Keep a slight bend in your knees. Your ears, shoulders, and hips should line up.  · When you do a task in which you stand in one place for a long time, place one foot up on a stable object that is 2-4 inches (5-10 cm) high, such as a footstool. This helps keep your spine neutral.  Sitting    · When sitting, keep your spine neutral and keep your feet flat on the floor. Use a footrest, if necessary, and keep your thighs parallel to the floor. Avoid rounding your shoulders, and avoid tilting your head forward.  · When working at a desk or a computer, keep your desk at a height where your hands are slightly lower than your elbows. Slide your chair under your desk so you are close enough to maintain good posture.  · When working at a computer, place your monitor at a height where you are looking straight ahead and you do not have to tilt your head forward or downward to look at the screen.  Resting    · When lying down and resting, avoid positions that are most painful for you.  · If you have pain with activities such as sitting, bending, stooping, or squatting  (flexion-based activities), lie in a position in which your body does not bend very much. For example, avoid curling up on your side with your arms and knees near your chest (fetal position).  · If you have pain with activities such as standing for a long time or reaching with your arms (extension-based activities), lie with your spine in a neutral position and bend your knees slightly. Try the following positions:  · Lying on your side with a   pillow between your knees.  · Lying on your back with a pillow under your knees.  Lifting    · When lifting objects, keep your feet at least shoulder-width apart and tighten your abdominal muscles.  · Bend your knees and hips and keep your spine neutral. It is important to lift using the strength of your legs, not your back. Do not lock your knees straight out.  · Always ask for help to lift heavy or awkward objects.  This information is not intended to replace advice given to you by your health care provider. Make sure you discuss any questions you have with your health care provider.  Document Released: 05/11/2005 Document Revised: 01/16/2016 Document Reviewed: 02/20/2015  [employer]vier Interactive Patient Education © 2018 [employer]

## 2017-10-15 NOTE — Progress Notes (Signed)
OFFICE VISIT  10/15/2017   CC:  Chief Complaint  Patient presents with  . Back Pain    lower  . Wrist Pain    right   HPI:    Patient is a 47 y.o.  male who presents for back pain. Onset of bilat/diffuse low back pain about 9 mo ago, comes and goes, tight/soreness.  Some tingling/numbness in proximal thighs upon waking up.  Forward flexion makes pain worse.  Back pain worse at night.  NO preceding trauma or strain prior to onset.  He had started playing golf at this time. No hx of x-rays of back.  No fevers.  No abnl wt loss.  No radicular pain or paresthesias down his legs.  Onset 2 wks ago, R wrist pain over ulnar region, started playing golf and making a bad swing, worse with certain movements. No meds have been taken except occ tylenol, minimal help with pain.    ROS: no undesired wt loss. No hx of malignancy.  No hx of blunt trauma or fall.  NO rash. No gluteal pain.  No LE swelling.  No saddle anesthesia.  No weakness in legs.  No fingers or wrist numbness, no hand weakness.  Past Medical History:  Diagnosis Date  . Hypertriglyceridemia 2014    History reviewed. No pertinent surgical history.  Outpatient Medications Prior to Visit  Medication Sig Dispense Refill  . Multiple Vitamin (MULTIVITAMIN) tablet Take 1 tablet by mouth daily.     No facility-administered medications prior to visit.     No Known Allergies  ROS As per HPI  PE: Blood pressure 129/79, pulse 61, temperature 97.9 F (36.6 C), temperature source Oral, resp. rate 16, height 6' (1.829 m), weight 228 lb (103.4 kg), SpO2 98 %. Gen: Alert, well appearing.  Patient is oriented to person, place, time, and situation. AFFECT: pleasant, lucid thought and speech. BACK EXAM:  Patient moves about the exam room without particular difficulty or stooped posture. Spine appears straight, back without bruising or other skin abnormality. Pt demonstrates decreased ROM of L spine in forward flexion (90 deg), with  normal extension, lateral flexion, and rotation, with no pain upon ROM except with forward flexion.. Palpation of back reveals no tenderness in paraspinous soft tissues, facet joint regions.  Mild pain over spinous processes of L spine diffusely.  No step offs.   SLR neg bilaterally.  LE strength 5/5 in proximal and distal muscles bilaterally. DTRs: absent patellar reflexes bilat,  1+ achilles bilaterally.   Right wrist: ROM intact, but some tenderness in distal ulna and over ulnar aspect of wrist, worse pain with eversion and inversion of wrist.  No pain with flexion or extension.  No erythema, swelling, or warmth.  LABS:    Chemistry      Component Value Date/Time   NA 137 08/17/2017 0858   K 4.4 08/17/2017 0858   CL 102 08/17/2017 0858   CO2 30 08/17/2017 0858   BUN 18 08/17/2017 0858   CREATININE 1.04 08/17/2017 0858      Component Value Date/Time   CALCIUM 9.3 08/17/2017 0858   ALKPHOS 56 08/17/2017 0858   AST 43 (H) 08/17/2017 0858   ALT 42 08/17/2017 0858   BILITOT 0.6 08/17/2017 0858       IMPRESSION AND PLAN:  1) Acute musculoskeletal low back pain:  Plan: stretches/home rehab. Heat application regularly. Mobic 15mg  qd x 2 wks --with food.  2) R wrist overuse sprain:  Relative rest. Wrist splint fitted in office  today and dispensed to pt: Mobic as rx'd above in #1. Discussed gradual return to normal activities, golf full swing being the last step.  An After Visit Summary was printed and given to the patient.  FOLLOW UP: Return if symptoms worsen or fail to improve.  Signed:  Crissie Sickles, MD           10/18/2017

## 2017-11-11 ENCOUNTER — Other Ambulatory Visit: Payer: Self-pay | Admitting: Family Medicine

## 2017-11-11 NOTE — Telephone Encounter (Signed)
RF request for meloxicam LOV: 10/15/17 Next ov: None Last written: 10/15/17 #30 w/ 0RF  Please advise. Thanks.

## 2018-03-25 ENCOUNTER — Ambulatory Visit: Payer: BLUE CROSS/BLUE SHIELD | Admitting: Family Medicine

## 2018-03-25 ENCOUNTER — Encounter: Payer: Self-pay | Admitting: Family Medicine

## 2018-03-25 VITALS — BP 112/71 | HR 54 | Temp 98.0°F | Resp 16 | Ht 72.0 in | Wt 222.5 lb

## 2018-03-25 DIAGNOSIS — R221 Localized swelling, mass and lump, neck: Secondary | ICD-10-CM | POA: Diagnosis not present

## 2018-03-25 NOTE — Progress Notes (Signed)
OFFICE VISIT  03/25/2018   CC:  Chief Complaint  Patient presents with  . Lump    on neck   HPI:    Patient is a 47 y.o.  male who presents for lump on neck.  First noted approx 2 days ago. Noted small swelling/nodular shaped in anterior neck near thyroid process when he was shaving. No pain.  No recent illness, fever, dental problem, or f/c/m or abnl wt loss.  Past Medical History:  Diagnosis Date  . Hypertriglyceridemia 2014    History reviewed. No pertinent surgical history.  Outpatient Medications Prior to Visit  Medication Sig Dispense Refill  . meloxicam (MOBIC) 15 MG tablet 1 tab po qd prn musculoskeletal pain (Patient not taking: Reported on 03/25/2018) 30 tablet 1  . Multiple Vitamin (MULTIVITAMIN) tablet Take 1 tablet by mouth daily.     No facility-administered medications prior to visit.     No Known Allergies  ROS As per HPI  PE: Blood pressure 112/71, pulse (!) 54, temperature 98 F (36.7 C), temperature source Oral, resp. rate 16, height 6' (1.829 m), weight 222 lb 8 oz (100.9 kg), SpO2 100 %. Gen: Alert, well appearing.  Patient is oriented to person, place, time, and situation. AFFECT: pleasant, lucid thought and speech. YME:BRAX: no injection, icteris, swelling, or exudate.  EOMI, PERRLA. Mouth: lips without lesion/swelling.  Oral mucosa pink and moist. Oropharynx without erythema, exudate, or swelling.  NECK: about 5 cm superior to sternal notch, just to the right of midline there is a 1 cm round, soft, moveable nodule.  No tenderness or fluctuance.  I cannot feel any thyromegaly.  No other nodular lesions palpable.    LABS:    Chemistry      Component Value Date/Time   NA 137 08/17/2017 0858   K 4.4 08/17/2017 0858   CL 102 08/17/2017 0858   CO2 30 08/17/2017 0858   BUN 18 08/17/2017 0858   CREATININE 1.04 08/17/2017 0858      Component Value Date/Time   CALCIUM 9.3 08/17/2017 0858   ALKPHOS 56 08/17/2017 0858   AST 43 (H) 08/17/2017 0858    ALT 42 08/17/2017 0858   BILITOT 0.6 08/17/2017 0858     Lab Results  Component Value Date   WBC 6.1 08/17/2017   HGB 14.4 08/17/2017   HCT 44.4 08/17/2017   MCV 81.4 08/17/2017   PLT 206.0 08/17/2017   Lab Results  Component Value Date   TSH 2.53 08/17/2017    IMPRESSION AND PLAN:  Anterior neck nodule: u/s soft tissue neck ordered. CBC and TSH ordered. Feels benign, and is likely too far up the neck to be a thyroid nodule, but thyroglossal duct cyst is a possibility.  Next step depends on result of u/s and labs.  An After Visit Summary was printed and given to the patient.  FOLLOW UP: Return for to be determined based on ultrasound results.  Signed:  Crissie Sickles, MD           03/25/2018

## 2018-03-26 LAB — CBC WITH DIFFERENTIAL/PLATELET
BASOS PCT: 0.3 %
Basophils Absolute: 20 cells/uL (ref 0–200)
EOS PCT: 2.1 %
Eosinophils Absolute: 143 cells/uL (ref 15–500)
HCT: 45.1 % (ref 38.5–50.0)
HEMOGLOBIN: 14.8 g/dL (ref 13.2–17.1)
Lymphs Abs: 2618 cells/uL (ref 850–3900)
MCH: 26.1 pg — ABNORMAL LOW (ref 27.0–33.0)
MCHC: 32.8 g/dL (ref 32.0–36.0)
MCV: 79.7 fL — ABNORMAL LOW (ref 80.0–100.0)
MONOS PCT: 9.3 %
MPV: 12.5 fL (ref 7.5–12.5)
NEUTROS ABS: 3386 {cells}/uL (ref 1500–7800)
Neutrophils Relative %: 49.8 %
Platelets: 217 10*3/uL (ref 140–400)
RBC: 5.66 10*6/uL (ref 4.20–5.80)
RDW: 12.7 % (ref 11.0–15.0)
Total Lymphocyte: 38.5 %
WBC mixed population: 632 cells/uL (ref 200–950)
WBC: 6.8 10*3/uL (ref 3.8–10.8)

## 2018-03-26 LAB — TSH: TSH: 1.76 mIU/L (ref 0.40–4.50)

## 2018-03-28 ENCOUNTER — Ambulatory Visit (INDEPENDENT_AMBULATORY_CARE_PROVIDER_SITE_OTHER): Payer: BLUE CROSS/BLUE SHIELD

## 2018-03-28 DIAGNOSIS — E042 Nontoxic multinodular goiter: Secondary | ICD-10-CM | POA: Diagnosis not present

## 2018-03-28 DIAGNOSIS — R221 Localized swelling, mass and lump, neck: Secondary | ICD-10-CM | POA: Diagnosis not present

## 2018-04-11 ENCOUNTER — Ambulatory Visit: Payer: BLUE CROSS/BLUE SHIELD | Admitting: Family Medicine

## 2018-04-15 ENCOUNTER — Ambulatory Visit: Payer: BLUE CROSS/BLUE SHIELD | Admitting: Family Medicine

## 2018-04-15 ENCOUNTER — Encounter: Payer: Self-pay | Admitting: Family Medicine

## 2018-04-15 VITALS — BP 115/80 | HR 62 | Resp 16 | Ht 72.0 in | Wt 221.0 lb

## 2018-04-15 DIAGNOSIS — D170 Benign lipomatous neoplasm of skin and subcutaneous tissue of head, face and neck: Secondary | ICD-10-CM | POA: Diagnosis not present

## 2018-04-15 NOTE — Progress Notes (Signed)
OFFICE VISIT  04/15/2018   CC:  Chief Complaint  Patient presents with  . Follow-up    neck mass     HPI:    Patient is a 47 y.o.  male who presents for 3 week f/u neck nodule. At that time on exam : about 5 cm superior to sternal notch, just to the right of midline there is a 1 cm round, soft, moveable nodule.  U/s soft tissue neck done 03/28/18 was essentially unremarkable: IMPRESSION: Normal cervical chain lymph nodes.  No identifiable mass or lesion.  Interim hx: He says he doesn't feel anything there right now. In hindsite, some desired wt loss has occurred and this may have made areas of the soft tissues in the neck more visible.    Past Medical History:  Diagnosis Date  . Hypertriglyceridemia 2014    History reviewed. No pertinent surgical history. No outpatient medications prior to visit.   No facility-administered medications prior to visit.     No Known Allergies  ROS As per HPI  PE: Blood pressure 115/80, pulse 62, resp. rate 16, height 6' (1.829 m), weight 221 lb (100.2 kg), SpO2 96 %. Gen: Alert, well appearing.  Patient is oriented to person, place, time, and situation. AFFECT: pleasant, lucid thought and speech. Neck: about 5 cm superior to sternal notch, just to the right of midline there is a 1 cm round, soft, moveable nodule. This has the consistency of fat.  LABS:  Lab Results  Component Value Date   WBC 6.8 03/25/2018   HGB 14.8 03/25/2018   HCT 45.1 03/25/2018   MCV 79.7 (L) 03/25/2018   PLT 217 03/25/2018   Lab Results  Component Value Date   TSH 1.76 03/25/2018    IMPRESSION AND PLAN:  Small lipoma: reassured pt. No further eval needed at this time. Signs/symptoms to call or return for were reviewed and pt expressed understanding.  An After Visit Summary was printed and given to the patient.  FOLLOW UP: Return for as needed.  Signed:  Crissie Sickles, MD           04/15/2018

## 2018-07-16 DIAGNOSIS — R05 Cough: Secondary | ICD-10-CM | POA: Diagnosis not present

## 2018-07-20 ENCOUNTER — Ambulatory Visit: Payer: BLUE CROSS/BLUE SHIELD | Admitting: Family Medicine

## 2018-07-20 ENCOUNTER — Encounter: Payer: Self-pay | Admitting: Family Medicine

## 2018-07-20 VITALS — BP 119/76 | HR 65 | Temp 97.7°F | Resp 16 | Ht 72.0 in | Wt 218.6 lb

## 2018-07-20 DIAGNOSIS — J209 Acute bronchitis, unspecified: Secondary | ICD-10-CM

## 2018-07-20 DIAGNOSIS — R05 Cough: Secondary | ICD-10-CM

## 2018-07-20 DIAGNOSIS — R059 Cough, unspecified: Secondary | ICD-10-CM

## 2018-07-20 MED ORDER — PREDNISONE 20 MG PO TABS: ORAL_TABLET | ORAL | 0 refills | Status: DC

## 2018-07-20 MED ORDER — METHYLPREDNISOLONE ACETATE 80 MG/ML IJ SUSP
80.0000 mg | Freq: Once | INTRAMUSCULAR | Status: AC
  Administered 2018-07-20: 80 mg via INTRAMUSCULAR

## 2018-07-20 NOTE — Progress Notes (Signed)
OFFICE VISIT  07/20/2018   CC:  Chief Complaint  Patient presents with  . Cough    2.5 wks ago, son had flu   HPI:    Patient is a 48 y.o.  male who presents for cough. Initially got flu-like illness about 2 wks ago-->fever, HA, cough: this all lasted 3d, recovered pretty quick after that except cough continued.  Cough has persisted.  He had some SOB last night, feeling chest tightness.  Makes him anxious.  This SOB/chest tightness was in context of uncontrolled episode of dry cough.  Some brief dizziness during this.  Pt unsure if he heard any wheezing.  No longer has any fevers or malaise.  Eating and drinking well. Proair 2 puffs helped last night. Took benzonatate but not helpful.  Dayquil helping a little.  His son had flu (rapid test +) 2 and 1/2 wks ago.  ROS: no CP, no rashes, no melena/hematochezia.  No polyuria or polydipsia.  No myalgias or arthralgias.   Past Medical History:  Diagnosis Date  . Hypertriglyceridemia 2014    No past surgical history on file.  Outpatient Medications Prior to Visit  Medication Sig Dispense Refill  . azelastine (ASTELIN) 0.1 % nasal spray     . fluticasone (FLONASE) 50 MCG/ACT nasal spray     . nystatin cream (MYCOSTATIN)     . PROAIR HFA 108 (90 Base) MCG/ACT inhaler     . benzonatate (TESSALON) 100 MG capsule     . NAPROXEN DR 500 MG EC tablet      No facility-administered medications prior to visit.     No Known Allergies  ROS As per HPI  PE: Blood pressure 119/76, pulse 65, temperature 97.7 F (36.5 C), temperature source Oral, resp. rate 16, height 6' (1.829 m), weight 218 lb 9.6 oz (99.2 kg), SpO2 99 %. Gen: Alert, well appearing.  Patient is oriented to person, place, time, and situation. AFFECT: pleasant, lucid thought and speech. ENT: Ears: EACs clear, normal epithelium.  TMs with good light reflex and landmarks bilaterally.  Eyes: no injection, icteris, swelling, or exudate.  EOMI, PERRLA. Nose: no drainage or  turbinate edema/swelling.  No injection or focal lesion.  Mouth: lips without lesion/swelling.  Oral mucosa pink and moist.  Dentition intact and without obvious caries or gingival swelling.  Oropharynx without erythema, exudate, or swelling.  Neck - No masses or thyromegaly or limitation in range of motion CV: RRR, no m/r/g.   LUNGS: CTA bilat, nonlabored resps, good aeration in all lung fields. EXT: no clubbing or cyanosis.  no edema.    LABS:    Chemistry      Component Value Date/Time   NA 137 08/17/2017 0858   K 4.4 08/17/2017 0858   CL 102 08/17/2017 0858   CO2 30 08/17/2017 0858   BUN 18 08/17/2017 0858   CREATININE 1.04 08/17/2017 0858      Component Value Date/Time   CALCIUM 9.3 08/17/2017 0858   ALKPHOS 56 08/17/2017 0858   AST 43 (H) 08/17/2017 0858   ALT 42 08/17/2017 0858   BILITOT 0.6 08/17/2017 0858       IMPRESSION AND PLAN:  1) Acute bronchitis, suspect recent resp virus triggered this.   Depo medrol 80 mg IM in office today. Prednisone 40mg  qd x 5d, then 20mg  qd x 5d. ProAir 2p q4h prn. May continue dayquil.  No need to continue naproxen or tessalon.  An After Visit Summary was printed and given to the patient.  FOLLOW UP: Return if symptoms worsen or fail to improve.  Signed:  Crissie Sickles, MD           07/20/2018

## 2018-10-22 DIAGNOSIS — M1A9XX1 Chronic gout, unspecified, with tophus (tophi): Secondary | ICD-10-CM | POA: Diagnosis not present

## 2018-11-17 ENCOUNTER — Encounter: Payer: Self-pay | Admitting: Family Medicine

## 2018-11-17 ENCOUNTER — Other Ambulatory Visit: Payer: Self-pay

## 2018-11-17 ENCOUNTER — Ambulatory Visit (INDEPENDENT_AMBULATORY_CARE_PROVIDER_SITE_OTHER): Payer: BC Managed Care – PPO | Admitting: Family Medicine

## 2018-11-17 VITALS — BP 124/82 | HR 51 | Temp 97.7°F | Resp 16 | Ht 72.0 in | Wt 220.0 lb

## 2018-11-17 DIAGNOSIS — M109 Gout, unspecified: Secondary | ICD-10-CM | POA: Diagnosis not present

## 2018-11-17 LAB — BASIC METABOLIC PANEL
BUN: 14 mg/dL (ref 6–23)
CO2: 28 mEq/L (ref 19–32)
Calcium: 9.1 mg/dL (ref 8.4–10.5)
Chloride: 103 mEq/L (ref 96–112)
Creatinine, Ser: 1.03 mg/dL (ref 0.40–1.50)
GFR: 77.47 mL/min (ref >60.00)
Glucose, Bld: 88 mg/dL (ref 70–99)
Potassium: 4.3 mEq/L (ref 3.5–5.1)
Sodium: 138 mEq/L (ref 135–145)

## 2018-11-17 LAB — URIC ACID: Uric Acid, Serum: 7.9 mg/dL — ABNORMAL HIGH (ref 4.0–7.8)

## 2018-11-17 MED ORDER — PREDNISONE 20 MG PO TABS: ORAL_TABLET | ORAL | 0 refills | Status: DC

## 2018-11-17 NOTE — Progress Notes (Signed)
OFFICE VISIT  11/17/2018   CC:  Chief Complaint  Patient presents with  . Edema    in toes, pain mainly occurs at night   HPI:    Patient is a 48 y.o.  male who presents for "swelling in toes". Has about 1 yr hx of intermittent swelling in R MTP joint, about 3 times total.  Two weeks ago he did a telemedicine visit and was rx'd indocin and it helped moderately well but never completely resolved, then the last 3 d it has returned significantly.  Worse with walking/wt bearing.  10/10 intensity at times. +Swelling, pain, darker pigment in some patches of skin in the area.  He describes some other periods of swelling of top of R foot in the past but not signif pain.   NO SX's in L foot/toes at all.  Of note, he had a glass of wine the night prior to the onset of his last 2 episodes. He has been trying to make changes to a vegetarian type diet for the last 1 mo or so.  ROS: no CP, no SOB, no wheezing, no cough, no dizziness, no HAs, no rashes, no melena/hematochezia.  No polyuria or polydipsia.   No fevers, chills, or malaise.  No abnl wt loss or gain. Some mild pain in medial aspect of L elbow lately but no swelling or redness or weakness. Some feeling of mild pain/stiffness/swelling in fingers of both hands of late.  Past Medical History:  Diagnosis Date  . Hypertriglyceridemia 2014    History reviewed. No pertinent surgical history.  Outpatient Medications Prior to Visit  Medication Sig Dispense Refill  . azelastine (ASTELIN) 0.1 % nasal spray     . fluticasone (FLONASE) 50 MCG/ACT nasal spray     . nystatin cream (MYCOSTATIN)     . predniSONE (DELTASONE) 20 MG tablet 2 tabs po qd x 5d, then 1 tab po qd x 5d 15 tablet 0  . PROAIR HFA 108 (90 Base) MCG/ACT inhaler      No facility-administered medications prior to visit.     No Known Allergies  ROS As per HPI  PE: Blood pressure 124/82, pulse (!) 51, temperature 97.7 F (36.5 C), temperature source Temporal, resp. rate  16, height 6' (1.829 m), weight 220 lb (99.8 kg), SpO2 100 %. Gen: Alert, well appearing.  Patient is oriented to person, place, time, and situation. AFFECT: pleasant, lucid thought and speech. R ankle and foot w/out warmth, tenderness, or swelling.  ROM intact. R MTP joint with tenderness to palpation diffusely, mild diffuse MTP swelling and some pain with resisted flexion/extension. No IP joint TTP. All other toes normal. Hands/fingers w/out swelling, tenderness, or warmth.  All ROM intact.  LABS:    Lab Results  Component Value Date   WBC 6.8 03/25/2018   HGB 14.8 03/25/2018   HCT 45.1 03/25/2018   MCV 79.7 (L) 03/25/2018   PLT 217 03/25/2018    Lab Results  Component Value Date   CHOL 213 (H) 08/17/2017   HDL 38.30 (L) 08/17/2017   LDLCALC 142 (H) 08/17/2017   TRIG 162.0 (H) 08/17/2017   CHOLHDL 6 08/17/2017    Lab Results  Component Value Date   TSH 1.76 03/25/2018      Chemistry      Component Value Date/Time   NA 137 08/17/2017 0858   K 4.4 08/17/2017 0858   CL 102 08/17/2017 0858   CO2 30 08/17/2017 0858   BUN 18 08/17/2017 0858  CREATININE 1.04 08/17/2017 0858      Component Value Date/Time   CALCIUM 9.3 08/17/2017 0858   ALKPHOS 56 08/17/2017 0858   AST 43 (H) 08/17/2017 0858   ALT 42 08/17/2017 0858   BILITOT 0.6 08/17/2017 0858     IMPRESSION AND PLAN:  1) Acute gouty arthritis of R MTP joint, sounds like his 3rd episode in the last year (prior to this he had NONE). Discussed low purine diet and gave handout. Check uric acid level (and BMET).  Pt wishes to defer allopurinol at this time. Prednisone 40mg  qd x 5d rx'd today.  An After Visit Summary was printed and given to the patient.  FOLLOW UP: Return if symptoms worsen or fail to improve.  Signed:  Crissie Sickles, MD           11/17/2018

## 2018-11-17 NOTE — Patient Instructions (Signed)

## 2018-11-20 ENCOUNTER — Encounter: Payer: Self-pay | Admitting: Family Medicine

## 2019-01-02 ENCOUNTER — Ambulatory Visit (INDEPENDENT_AMBULATORY_CARE_PROVIDER_SITE_OTHER): Payer: BC Managed Care – PPO | Admitting: Family Medicine

## 2019-01-02 ENCOUNTER — Other Ambulatory Visit: Payer: Self-pay

## 2019-01-02 ENCOUNTER — Encounter: Payer: Self-pay | Admitting: Family Medicine

## 2019-01-02 VITALS — BP 125/80 | HR 60 | Temp 98.0°F | Resp 16 | Ht 72.0 in | Wt 217.8 lb

## 2019-01-02 DIAGNOSIS — M109 Gout, unspecified: Secondary | ICD-10-CM

## 2019-01-02 DIAGNOSIS — M1990 Unspecified osteoarthritis, unspecified site: Secondary | ICD-10-CM | POA: Diagnosis not present

## 2019-01-02 DIAGNOSIS — Z0000 Encounter for general adult medical examination without abnormal findings: Secondary | ICD-10-CM | POA: Diagnosis not present

## 2019-01-02 MED ORDER — CELECOXIB 200 MG PO CAPS: ORAL_CAPSULE | ORAL | 1 refills | Status: DC

## 2019-01-02 MED ORDER — ALLOPURINOL 100 MG PO TABS: 100.0000 mg | ORAL_TABLET | Freq: Every day | ORAL | 1 refills | Status: DC

## 2019-01-02 NOTE — Patient Instructions (Signed)

## 2019-01-02 NOTE — Progress Notes (Addendum)
Office Note 01/02/2019  CC:  Chief Complaint  Patient presents with  . Annual Exam    pt is fasting    HPI:  Austin Sanchez is a 48 y.o. male who is here for annual health maintenance exam.   Has had 3 more episode of R foot podagra since I saw him last, most recent one is getting better taking advil x 3d.  Has been eating low purine diet. Also, he noted that about 6-8 mo ago he started having lots of diffuse joint aching/stiffness worse in night and significant stiffness in the morning, sometimes a few hours.  Sometimes has trouble doing normal activities.  Hands MCPs and wrists are the worst.  Sometimes diffusely in toes.  Knees and shoulders feel fine.  Prednisone has helped all joint complaints for at least 1-2 wks. No joint swelling except the R MTP joint.  Some forearm pains in late evenings, points to medial epicondyle too.  No rashes.  No oral ulcers.  No fevers.  No fatigue.  No oral ulcers, no red eyes or pain in eyes. Exercising some.     Past Medical History:  Diagnosis Date  . Gout 2019/2020  . Hypertriglyceridemia 2014    No past surgical history on file.  Family History  Problem Relation Age of Onset  . Diabetes Mother   . Heart disease Mother   . Stroke Mother   . Diabetes Father   . Stroke Maternal Grandfather   . Cancer Neg Hx     Social History   Socioeconomic History  . Marital status: Married    Spouse name: Not on file  . Number of children: Not on file  . Years of education: Not on file  . Highest education level: Not on file  Occupational History  . Not on file  Social Needs  . Financial resource strain: Not on file  . Food insecurity    Worry: Not on file    Inability: Not on file  . Transportation needs    Medical: Not on file    Non-medical: Not on file  Tobacco Use  . Smoking status: Never Smoker  . Smokeless tobacco: Never Used  Substance and Sexual Activity  . Alcohol use: Yes    Comment: twice a month 1 glass of wine  .  Drug use: No  . Sexual activity: Not on file  Lifestyle  . Physical activity    Days per week: Not on file    Minutes per session: Not on file  . Stress: Not on file  Relationships  . Social Herbalist on phone: Not on file    Gets together: Not on file    Attends religious service: Not on file    Active member of club or organization: Not on file    Attends meetings of clubs or organizations: Not on file    Relationship status: Not on file  . Intimate partner violence    Fear of current or ex partner: Not on file    Emotionally abused: Not on file    Physically abused: Not on file    Forced sexual activity: Not on file  Other Topics Concern  . Not on file  Social History Narrative   Married, male partner.  Three children.   Educ: PhD: [employer].  Relocated from [facility] 2015.   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  Occasional glass of wine.    Outpatient Medications Prior to  Visit  Medication Sig Dispense Refill  . ibuprofen (ADVIL) 100 MG tablet Take 100 mg by mouth as needed for pain.    . Multiple Vitamins-Minerals (MULTIVITAMIN MEN) TABS Take by mouth daily.    Marland Kitchen azelastine (ASTELIN) 0.1 % nasal spray     . fluticasone (FLONASE) 50 MCG/ACT nasal spray     . nystatin cream (MYCOSTATIN)     . predniSONE (DELTASONE) 20 MG tablet 2 tabs po qd x 5 d (Patient not taking: Reported on 01/02/2019) 10 tablet 0   No facility-administered medications prior to visit.     No Known Allergies  ROS Review of Systems  Constitutional: Negative for appetite change, chills, fatigue and fever.  HENT: Negative for congestion, dental problem, ear pain and sore throat.   Eyes: Negative for discharge, redness and visual disturbance.  Respiratory: Negative for cough, chest tightness, shortness of breath and wheezing.   Cardiovascular: Negative for chest pain, palpitations and leg swelling.  Gastrointestinal: Negative for  abdominal pain, blood in stool, diarrhea, nausea and vomiting.  Genitourinary: Negative for difficulty urinating, dysuria, flank pain, frequency, hematuria and urgency.  Musculoskeletal: Positive for arthralgias (see hpi). Negative for back pain, joint swelling, myalgias and neck stiffness.  Skin: Negative for pallor and rash.  Neurological: Negative for dizziness, speech difficulty, weakness and headaches.  Hematological: Negative for adenopathy. Does not bruise/bleed easily.  Psychiatric/Behavioral: Negative for confusion and sleep disturbance. The patient is not nervous/anxious.     PE; Blood pressure 125/80, pulse 60, temperature 98 F (36.7 C), temperature source Temporal, resp. rate 16, height 6' (1.829 m), weight 217 lb 12.8 oz (98.8 kg), SpO2 98 %. Body mass index is 29.54 kg/m.  Gen: Alert, well appearing.  Patient is oriented to person, place, time, and situation. AFFECT: pleasant, lucid thought and speech. ENT: Ears: EACs clear, normal epithelium.  TMs with good light reflex and landmarks bilaterally.  Eyes: no injection, icteris, swelling, or exudate.  EOMI, PERRLA. Nose: no drainage or turbinate edema/swelling.  No injection or focal lesion.  Mouth: lips without lesion/swelling.  Oral mucosa pink and moist.  Dentition intact and without obvious caries or gingival swelling.  Oropharynx without erythema, exudate, or swelling.  Neck: supple/nontender.  No LAD, mass, or TM.  Carotid pulses 2+ bilaterally, without bruits. CV: RRR, no m/r/g.   LUNGS: CTA bilat, nonlabored resps, good aeration in all lung fields. ABD: soft, NT, ND, BS normal.  No hepatospenomegaly or mass.  No bruits. EXT: no clubbing, cyanosis, or edema.  Musculoskeletal: mild swelling of R MTP joint with tenderness over this joint diffusely +mild warmth. Mild TTP over medial aspect of L elbow just proximal to the medial epicondyle.  Otherwise no joint swelling, erythema, warmth, or tenderness.  ROM of all joints  intact. Skin - no sores or suspicious lesions or rashes or color changes   Pertinent labs:   Lab Results  Component Value Date   LABURIC 7.9 (H) 11/17/2018    Lab Results  Component Value Date   TSH 1.76 03/25/2018   Lab Results  Component Value Date   WBC 6.8 03/25/2018   HGB 14.8 03/25/2018   HCT 45.1 03/25/2018   MCV 79.7 (L) 03/25/2018   PLT 217 03/25/2018   Lab Results  Component Value Date   CREATININE 1.03 11/17/2018   BUN 14 11/17/2018   NA 138 11/17/2018   K 4.3 11/17/2018   CL 103 11/17/2018   CO2 28 11/17/2018   Lab Results  Component Value Date  ALT 42 08/17/2017   AST 43 (H) 08/17/2017   ALKPHOS 56 08/17/2017   BILITOT 0.6 08/17/2017   Lab Results  Component Value Date   CHOL 213 (H) 08/17/2017   Lab Results  Component Value Date   HDL 38.30 (L) 08/17/2017   Lab Results  Component Value Date   LDLCALC 142 (H) 08/17/2017   Lab Results  Component Value Date   TRIG 162.0 (H) 08/17/2017   Lab Results  Component Value Date   CHOLHDL 6 08/17/2017   ASSESSMENT AND PLAN:   1) Gout: recurrent in R MTP jt. Uric acid was a little high about 6 wks ago. Three recurrences in same joint in the last 6 wks. Start allopurinol 100 mg qd.  Recheck Uric acid today as well as at his f/u visit in 1 mo. Currently finishing up a flare.  Will hold off on any steroids at this time.  2) Polyarthralgia, pretty much symmetric--would be atypical for gout. Will do eval for inflamm arth-->ESR, parvo 19 IgM and IgG, Rh factor, CCP IgA, ANA w/reflex, Lyme titers, HIV. Also check for joint erosion with L hand and wrist plain films. Start celebrex 200 mg qd prn. May have to ask rheum to see him but we'll wait until f/u in 1 mo to decide unless he gets worse between now and then.  3) Health maintenance exam: Reviewed age and gender appropriate health maintenance issues (prudent diet, regular exercise, health risks of tobacco and excessive alcohol, use of seatbelts,  fire alarms in home, use of sunscreen).  Also reviewed age and gender appropriate health screening as well as vaccine recommendations. Vaccines: UTD. Labs: fasting HP today. Prostate ca screening: average risk patient= as per latest guidelines, start screening at 105 yrs of age. Colon ca screening: average risk patient= as per latest guidelines, start screening at 62 yrs of age.   An After Visit Summary was printed and given to the patient.  FOLLOW UP:  No follow-ups on file.  Signed:  Crissie Sickles, MD           01/02/2019

## 2019-01-03 LAB — COMPREHENSIVE METABOLIC PANEL
ALT: 19 U/L (ref 0–53)
AST: 20 U/L (ref 0–37)
Albumin: 4.3 g/dL (ref 3.5–5.2)
Alkaline Phosphatase: 57 U/L (ref 39–117)
BUN: 13 mg/dL (ref 6–23)
CO2: 25 mEq/L (ref 19–32)
Calcium: 9.3 mg/dL (ref 8.4–10.5)
Chloride: 102 mEq/L (ref 96–112)
Creatinine, Ser: 0.97 mg/dL (ref 0.40–1.50)
GFR: 82.98 mL/min (ref >60.00)
Glucose, Bld: 82 mg/dL (ref 70–99)
Potassium: 3.9 mEq/L (ref 3.5–5.1)
Sodium: 136 mEq/L (ref 135–145)
Total Bilirubin: 0.8 mg/dL (ref 0.2–1.2)
Total Protein: 6.9 g/dL (ref 6.0–8.3)

## 2019-01-03 LAB — CBC WITH DIFFERENTIAL/PLATELET
Basophils Absolute: 0.1 10*3/uL (ref 0.0–0.1)
Basophils Relative: 1.4 % (ref 0.0–3.0)
Eosinophils Absolute: 0.1 10*3/uL (ref 0.0–0.7)
Eosinophils Relative: 1.9 % (ref 0.0–5.0)
HCT: 41.2 % (ref 39.0–52.0)
Hemoglobin: 13.4 g/dL (ref 13.0–17.0)
Lymphocytes Relative: 31.8 % (ref 12.0–46.0)
Lymphs Abs: 2.1 10*3/uL (ref 0.7–4.0)
MCHC: 32.5 g/dL (ref 30.0–36.0)
MCV: 82.4 fl (ref 78.0–100.0)
Monocytes Absolute: 0.6 10*3/uL (ref 0.1–1.0)
Monocytes Relative: 8.5 % (ref 3.0–12.0)
Neutro Abs: 3.8 10*3/uL (ref 1.4–7.7)
Neutrophils Relative %: 56.4 % (ref 43.0–77.0)
Platelets: 172 10*3/uL (ref 150.0–400.0)
RBC: 5.01 Mil/uL (ref 4.22–5.81)
RDW: 13.6 % (ref 11.5–15.5)
WBC: 6.7 10*3/uL (ref 4.0–10.5)

## 2019-01-03 LAB — LIPID PANEL
Cholesterol: 200 mg/dL (ref 0–200)
HDL: 37.3 mg/dL — ABNORMAL LOW (ref >39.00)
LDL Cholesterol: 132 mg/dL — ABNORMAL HIGH (ref 0–99)
NonHDL: 163.06
Total CHOL/HDL Ratio: 5
Triglycerides: 156 mg/dL — ABNORMAL HIGH (ref 0.0–149.0)
VLDL: 31.2 mg/dL (ref 0.0–40.0)

## 2019-01-03 LAB — TSH: TSH: 1.51 u[IU]/mL (ref 0.35–4.50)

## 2019-01-03 LAB — URIC ACID: Uric Acid, Serum: 8 mg/dL — ABNORMAL HIGH (ref 4.0–7.8)

## 2019-01-03 LAB — SEDIMENTATION RATE: Sed Rate: 33 mm/hr — ABNORMAL HIGH (ref 0–15)

## 2019-01-04 ENCOUNTER — Ambulatory Visit (INDEPENDENT_AMBULATORY_CARE_PROVIDER_SITE_OTHER): Payer: BC Managed Care – PPO

## 2019-01-04 ENCOUNTER — Other Ambulatory Visit: Payer: Self-pay

## 2019-01-04 DIAGNOSIS — M25532 Pain in left wrist: Secondary | ICD-10-CM | POA: Diagnosis not present

## 2019-01-04 DIAGNOSIS — M1990 Unspecified osteoarthritis, unspecified site: Secondary | ICD-10-CM

## 2019-01-04 DIAGNOSIS — M79642 Pain in left hand: Secondary | ICD-10-CM | POA: Diagnosis not present

## 2019-01-04 LAB — LYME AB/WESTERN BLOT REFLEX
LYME DISEASE AB, QUANT, IGM: <0.8 index (ref 0.00–0.79)
Lyme IgG/IgM Ab: <0.91 {ISR} (ref 0.00–0.90)

## 2019-01-04 LAB — CYCLIC CITRUL PEPTIDE ANTIBODY, IGG/IGA: Cyclic Citrullin Peptide Ab: 4 units (ref 0–19)

## 2019-01-05 LAB — ANA SCREEN,IFA,REFLEX TITER/PATTERN,REFLEX MPLX 11 AB CASCADE
14-3-3 eta Protein: <0.2 ng/mL (ref <0.2)
Anti Nuclear Antibody (ANA): POSITIVE — AB
Cyclic Citrullin Peptide Ab: <16 UNITS
Rheumatoid fact SerPl-aCnc: <14 IU/mL (ref <14)

## 2019-01-05 LAB — ANTI-NUCLEAR AB-TITER (ANA TITER)
ANA TITER: 1:80 {titer} — ABNORMAL HIGH
ANA Titer 1: 1:80 {titer} — ABNORMAL HIGH

## 2019-01-05 LAB — PARVOVIRUS B19 ANTIBODY, IGG AND IGM
Parvovirus B19 IgG: 5.1 — ABNORMAL HIGH (ref <0.9)
Parvovirus B19 IgM: 0.2 (ref <0.9)

## 2019-01-05 LAB — HIV ANTIBODY (ROUTINE TESTING W REFLEX): HIV 1&2 Ab, 4th Generation: NONREACTIVE

## 2019-01-05 LAB — TIER 2
Jo-1 Autoabs: <1 AI
SSA (Ro) (ENA) Antibody, IgG: <1 AI
SSB (La) (ENA) Antibody, IgG: <1 AI
Scleroderma (Scl-70) (ENA) Antibody, IgG: <1 AI

## 2019-01-05 LAB — TIER 1
Chromatin (Nucleosomal) Antibody: <1 AI
ENA SM Ab Ser-aCnc: <1 AI
Ribonucleic Protein(ENA) Antibody, IgG: <1 AI
SM/RNP: <1 AI
ds DNA Ab: 5 IU/mL — ABNORMAL HIGH

## 2019-01-05 LAB — INTERPRETATION

## 2019-01-05 LAB — TIER 3
Centromere Ab Screen: <1 AI
Ribosomal P Protein Ab: <1 AI

## 2019-02-10 ENCOUNTER — Encounter: Payer: Self-pay | Admitting: Family Medicine

## 2019-02-10 ENCOUNTER — Other Ambulatory Visit: Payer: Self-pay

## 2019-02-10 ENCOUNTER — Ambulatory Visit: Payer: BC Managed Care – PPO | Admitting: Family Medicine

## 2019-02-10 VITALS — BP 125/81 | HR 60 | Temp 98.0°F | Resp 16 | Ht 72.0 in | Wt 220.0 lb

## 2019-02-10 DIAGNOSIS — Z23 Encounter for immunization: Secondary | ICD-10-CM | POA: Diagnosis not present

## 2019-02-10 DIAGNOSIS — M1A0710 Idiopathic chronic gout, right ankle and foot, without tophus (tophi): Secondary | ICD-10-CM | POA: Diagnosis not present

## 2019-02-10 DIAGNOSIS — M2550 Pain in unspecified joint: Secondary | ICD-10-CM

## 2019-02-10 DIAGNOSIS — M25649 Stiffness of unspecified hand, not elsewhere classified: Secondary | ICD-10-CM | POA: Diagnosis not present

## 2019-02-10 DIAGNOSIS — M791 Myalgia, unspecified site: Secondary | ICD-10-CM

## 2019-02-10 NOTE — Progress Notes (Addendum)
OFFICE VISIT  02/10/2019   CC:  Chief Complaint  Patient presents with  . Follow-up    gout medication is working. took medication last night. hasnt had any flare ups.     HPI:    Patient is a 48 y.o.  male who presents for 5 week f/u polyarthralgia and gout. After about a week on allopurinol he says all his gout pain in R MTP jt resolved completely. Still some hands stiffness in morning but this is better.  Wrists sometimes painful but this is much improved as well.  Occ thighs, shoulders and calves soreness--all better with prn use of celebrex.  Celebrex does help this significantly. Can exercise regularly now w/out problems. Sleep is improved.  No side effects from allopurinol.  ROS: no CP, no SOB, no wheezing, no cough, no dizziness, no HAs, no rashes, no melena/hematochezia.  No polyuria or polydipsia.  No fevers. No eye pain, no oral ulcers.   Past Medical History:  Diagnosis Date  . Gout 2019/2020  . Hypertriglyceridemia 2014    History reviewed. No pertinent surgical history.  Outpatient Medications Prior to Visit  Medication Sig Dispense Refill  . allopurinol (ZYLOPRIM) 100 MG tablet Take 1 tablet (100 mg total) by mouth daily. 30 tablet 1  . celecoxib (CELEBREX) 200 MG capsule 1 tab po qd prn joint pains/stiffness 30 capsule 1  . ibuprofen (ADVIL) 100 MG tablet Take 100 mg by mouth as needed for pain.    . Multiple Vitamins-Minerals (MULTIVITAMIN MEN) TABS Take by mouth daily.     No facility-administered medications prior to visit.     Allergies  Allergen Reactions  . Cheese Nausea Only    Blue cheese    ROS As per HPI  PE: Blood pressure 125/81, pulse 60, temperature 98 F (36.7 C), temperature source Temporal, resp. rate 16, height 6' (1.829 m), weight 220 lb (99.8 kg), SpO2 100 %. Gen: Alert, well appearing.  Patient is oriented to person, place, time, and situation. AFFECT: pleasant, lucid thought and speech. No further exam today.  LABS:   Lab Results  Component Value Date   LABURIC 8.0 (H) 01/02/2019   Lab Results  Component Value Date   WBC 6.7 01/02/2019   HGB 13.4 01/02/2019   HCT 41.2 01/02/2019   MCV 82.4 01/02/2019   PLT 172.0 01/02/2019     Chemistry      Component Value Date/Time   NA 136 01/02/2019 1439   K 3.9 01/02/2019 1439   CL 102 01/02/2019 1439   CO2 25 01/02/2019 1439   BUN 13 01/02/2019 1439   CREATININE 0.97 01/02/2019 1439      Component Value Date/Time   CALCIUM 9.3 01/02/2019 1439   ALKPHOS 57 01/02/2019 1439   AST 20 01/02/2019 1439   ALT 19 01/02/2019 1439   BILITOT 0.8 01/02/2019 1439     Lab Results  Component Value Date   TSH 1.51 01/02/2019   Lab Results  Component Value Date   ANA POSITIVE (A) 01/02/2019   1:80 titer.  01/02/19  IMPRESSION AND PLAN:  1) Gouty arthritis: doing excellent since being on allopurinol 100mg  qd x 1 mo. Recheck uric acid level today. Renal function normal.    2) Mild intermittent, symmetric wrist arthralgias and hand stiffness, with some nonspecific mild thigh and calves soreness.  Exam normal last visit, wrist and hand x-ray w/out erosion. I don't think his ANA is clinically relevant at this time, but we discussed signs/symptoms to call or return  for that would prompt retesting and/or rheum referral. He'll continue celebrex 200mg  qd prn at this time, which he seems to be using infrequently.  An After Visit Summary was printed and given to the patient.  FOLLOW UP: Return for arrange for CPE in 12/2019.  Signed:  Crissie Sickles, MD           02/10/2019

## 2019-02-11 LAB — URIC ACID: Uric Acid, Serum: 6.2 mg/dL (ref 4.0–8.0)

## 2019-03-10 ENCOUNTER — Other Ambulatory Visit: Payer: Self-pay

## 2019-03-10 MED ORDER — ALLOPURINOL 100 MG PO TABS: 100.0000 mg | ORAL_TABLET | Freq: Every day | ORAL | 1 refills | Status: DC

## 2019-06-29 ENCOUNTER — Other Ambulatory Visit: Payer: Self-pay

## 2019-06-29 MED ORDER — CELECOXIB 200 MG PO CAPS: ORAL_CAPSULE | ORAL | 1 refills | Status: DC

## 2019-09-25 ENCOUNTER — Other Ambulatory Visit: Payer: Self-pay

## 2019-09-25 MED ORDER — ALLOPURINOL 100 MG PO TABS: 100.0000 mg | ORAL_TABLET | Freq: Every day | ORAL | 0 refills | Status: DC

## 2019-12-27 ENCOUNTER — Other Ambulatory Visit: Payer: Self-pay | Admitting: Family Medicine

## 2020-01-05 ENCOUNTER — Encounter: Payer: BC Managed Care – PPO | Admitting: Family Medicine

## 2020-01-05 NOTE — Progress Notes (Deleted)
Office Note 01/05/2020  CC: No chief complaint on file.   HPI:  Austin Sanchez is a 49 y.o. male who is here for annual health maintenance exam.   Past Medical History:  Diagnosis Date  . Gout 2019/2020  . Hypertriglyceridemia 2014    No past surgical history on file.  Family History  Problem Relation Age of Onset  . Diabetes Mother   . Heart disease Mother   . Stroke Mother   . Diabetes Father   . Stroke Maternal Grandfather   . Cancer Neg Hx     Social History   Socioeconomic History  . Marital status: Married    Spouse name: Not on file  . Number of children: Not on file  . Years of education: Not on file  . Highest education level: Not on file  Occupational History  . Not on file  Tobacco Use  . Smoking status: Never Smoker  . Smokeless tobacco: Never Used  Vaping Use  . Vaping Use: Never used  Substance and Sexual Activity  . Alcohol use: Yes    Comment: twice a month 1 glass of wine  . Drug use: No  . Sexual activity: Not on file  Other Topics Concern  . Not on file  Social History Narrative   Married, male partner.  Three children.   Educ: PhD: [employer].  Relocated from [facility] 2015.   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  Occasional glass of wine.   Social Determinants of Health   Financial Resource Strain:   . Difficulty of Paying Living Expenses:   Food Insecurity:   . Worried About Charity fundraiser in the Last Year:   . Arboriculturist in the Last Year:   Transportation Needs:   . Film/video editor (Medical):   Marland Kitchen Lack of Transportation (Non-Medical):   Physical Activity:   . Days of Exercise per Week:   . Minutes of Exercise per Session:   Stress:   . Feeling of Stress :   Social Connections:   . Frequency of Communication with Friends and Family:   . Frequency of Social Gatherings with Friends and Family:   . Attends Religious Services:   . Active Member of Clubs or  Organizations:   . Attends Archivist Meetings:   Marland Kitchen Marital Status:   Intimate Partner Violence:   . Fear of Current or Ex-Partner:   . Emotionally Abused:   Marland Kitchen Physically Abused:   . Sexually Abused:     Outpatient Medications Prior to Visit  Medication Sig Dispense Refill  . allopurinol (ZYLOPRIM) 100 MG tablet TAKE 1 TABLET(100 MG) BY MOUTH DAILY 90 tablet 0  . celecoxib (CELEBREX) 200 MG capsule 1 tab po qd prn joint pains/stiffness 30 capsule 1  . ibuprofen (ADVIL) 100 MG tablet Take 100 mg by mouth as needed for pain.    . Multiple Vitamins-Minerals (MULTIVITAMIN MEN) TABS Take by mouth daily.     No facility-administered medications prior to visit.    Allergies  Allergen Reactions  . Cheese Nausea Only    Blue cheese    ROS *** PE; There were no vitals taken for this visit. *** Pertinent labs:  Lab Results  Component Value Date   TSH 1.51 01/02/2019   Lab Results  Component Value Date   WBC 6.7 01/02/2019   HGB 13.4 01/02/2019   HCT 41.2 01/02/2019   MCV 82.4 01/02/2019   PLT 172.0 01/02/2019  Lab Results  Component Value Date   CREATININE 0.97 01/02/2019   BUN 13 01/02/2019   NA 136 01/02/2019   K 3.9 01/02/2019   CL 102 01/02/2019   CO2 25 01/02/2019   Lab Results  Component Value Date   ALT 19 01/02/2019   AST 20 01/02/2019   ALKPHOS 57 01/02/2019   BILITOT 0.8 01/02/2019   Lab Results  Component Value Date   CHOL 200 01/02/2019   Lab Results  Component Value Date   HDL 37.30 (L) 01/02/2019   Lab Results  Component Value Date   LDLCALC 132 (H) 01/02/2019   Lab Results  Component Value Date   TRIG 156.0 (H) 01/02/2019   Lab Results  Component Value Date   CHOLHDL 5 01/02/2019   Lab Results  Component Value Date   LABURIC 6.2 02/10/2019   ASSESSMENT AND PLAN:   Health maintenance exam: Reviewed age and gender appropriate health maintenance issues (prudent diet, regular exercise, health risks of tobacco and  excessive alcohol, use of seatbelts, fire alarms in home, use of sunscreen).  Also reviewed age and gender appropriate health screening as well as vaccine recommendations. Vaccines: Tdap UTD.  Covid 19->***. Labs: fasting HP ordered Prostate ca screening: average risk patient= as per latest guidelines, start screening at 34 yrs of age. Colon ca screening: average risk patient= as per latest guidelines, start screening at 45 yrs of age-->***.   An After Visit Summary was printed and given to the patient.  FOLLOW UP:  No follow-ups on file.  Signed:  Crissie Sickles, MD           01/05/2020

## 2020-01-16 DIAGNOSIS — Z03818 Encounter for observation for suspected exposure to other biological agents ruled out: Secondary | ICD-10-CM | POA: Diagnosis not present

## 2020-01-16 DIAGNOSIS — Z1152 Encounter for screening for COVID-19: Secondary | ICD-10-CM | POA: Diagnosis not present

## 2020-01-26 ENCOUNTER — Other Ambulatory Visit: Payer: Self-pay

## 2020-01-26 ENCOUNTER — Ambulatory Visit (INDEPENDENT_AMBULATORY_CARE_PROVIDER_SITE_OTHER): Payer: BC Managed Care – PPO | Admitting: Family Medicine

## 2020-01-26 ENCOUNTER — Encounter: Payer: Self-pay | Admitting: Family Medicine

## 2020-01-26 VITALS — BP 137/81 | HR 74 | Temp 98.2°F | Resp 16 | Ht 72.84 in | Wt 235.0 lb

## 2020-01-26 DIAGNOSIS — Z1211 Encounter for screening for malignant neoplasm of colon: Secondary | ICD-10-CM | POA: Diagnosis not present

## 2020-01-26 DIAGNOSIS — Z0000 Encounter for general adult medical examination without abnormal findings: Secondary | ICD-10-CM | POA: Diagnosis not present

## 2020-01-26 DIAGNOSIS — Z23 Encounter for immunization: Secondary | ICD-10-CM | POA: Diagnosis not present

## 2020-01-26 DIAGNOSIS — E663 Overweight: Secondary | ICD-10-CM

## 2020-01-26 DIAGNOSIS — M109 Gout, unspecified: Secondary | ICD-10-CM | POA: Diagnosis not present

## 2020-01-26 DIAGNOSIS — E781 Pure hyperglyceridemia: Secondary | ICD-10-CM

## 2020-01-26 LAB — LIPID PANEL
Cholesterol: 199 mg/dL (ref 0–200)
HDL: 37.8 mg/dL — ABNORMAL LOW (ref >39.00)
LDL Cholesterol: 123 mg/dL — ABNORMAL HIGH (ref 0–99)
NonHDL: 160.78
Total CHOL/HDL Ratio: 5
Triglycerides: 188 mg/dL — ABNORMAL HIGH (ref 0.0–149.0)
VLDL: 37.6 mg/dL (ref 0.0–40.0)

## 2020-01-26 LAB — COMPREHENSIVE METABOLIC PANEL
ALT: 24 U/L (ref 0–53)
AST: 24 U/L (ref 0–37)
Albumin: 4.2 g/dL (ref 3.5–5.2)
Alkaline Phosphatase: 55 U/L (ref 39–117)
BUN: 15 mg/dL (ref 6–23)
CO2: 28 mEq/L (ref 19–32)
Calcium: 9.2 mg/dL (ref 8.4–10.5)
Chloride: 103 mEq/L (ref 96–112)
Creatinine, Ser: 1.11 mg/dL (ref 0.40–1.50)
GFR: 70.7 mL/min (ref >60.00)
Glucose, Bld: 81 mg/dL (ref 70–99)
Potassium: 4.3 mEq/L (ref 3.5–5.1)
Sodium: 138 mEq/L (ref 135–145)
Total Bilirubin: 0.7 mg/dL (ref 0.2–1.2)
Total Protein: 7 g/dL (ref 6.0–8.3)

## 2020-01-26 LAB — CBC WITH DIFFERENTIAL/PLATELET
Basophils Absolute: 0 10*3/uL (ref 0.0–0.1)
Basophils Relative: 0.6 % (ref 0.0–3.0)
Eosinophils Absolute: 0.2 10*3/uL (ref 0.0–0.7)
Eosinophils Relative: 3.2 % (ref 0.0–5.0)
HCT: 42.6 % (ref 39.0–52.0)
Hemoglobin: 13.6 g/dL (ref 13.0–17.0)
Lymphocytes Relative: 32 % (ref 12.0–46.0)
Lymphs Abs: 2.2 10*3/uL (ref 0.7–4.0)
MCHC: 31.9 g/dL (ref 30.0–36.0)
MCV: 82.5 fl (ref 78.0–100.0)
Monocytes Absolute: 0.7 10*3/uL (ref 0.1–1.0)
Monocytes Relative: 10.1 % (ref 3.0–12.0)
Neutro Abs: 3.7 10*3/uL (ref 1.4–7.7)
Neutrophils Relative %: 54.1 % (ref 43.0–77.0)
Platelets: 198 10*3/uL (ref 150.0–400.0)
RBC: 5.17 Mil/uL (ref 4.22–5.81)
RDW: 13.6 % (ref 11.5–15.5)
WBC: 6.9 10*3/uL (ref 4.0–10.5)

## 2020-01-26 LAB — TSH: TSH: 2.29 u[IU]/mL (ref 0.35–4.50)

## 2020-01-26 LAB — URIC ACID: Uric Acid, Serum: 6.6 mg/dL (ref 4.0–7.8)

## 2020-01-26 NOTE — Progress Notes (Signed)
Office Note 01/26/2020  CC:  Chief Complaint  Patient presents with  . Annual Exam    HPI:  Austin Sanchez is a 49 y.o.  male who is here for annual health maintenance exam and f/u gout. Feeling well. Home bp checks: 127/90.  HR not recalled by pt. Exercise is sporadic. Diet: eats lots of rice, has cut down on red meat a lot.  Portion size is good.    Never gets any flares since being on allopurinol.  D/c of milk has helped chronic mild joint aches.  Taking celecoxib only prn---only occasional.  No other NSAIDs.  Past Medical History:  Diagnosis Date  . Gout 2019/2020  . Hypertriglyceridemia 2014    History reviewed. No pertinent surgical history.  Family History  Problem Relation Age of Onset  . Diabetes Mother   . Heart disease Mother   . Stroke Mother   . Diabetes Father   . Stroke Maternal Grandfather   . Cancer Neg Hx     Social History   Socioeconomic History  . Marital status: Married    Spouse name: Not on file  . Number of children: Not on file  . Years of education: Not on file  . Highest education level: Not on file  Occupational History  . Not on file  Tobacco Use  . Smoking status: Never Smoker  . Smokeless tobacco: Never Used  Vaping Use  . Vaping Use: Never used  Substance and Sexual Activity  . Alcohol use: Yes    Comment: twice a month 1 glass of wine  . Drug use: No  . Sexual activity: Not on file  Other Topics Concern  . Not on file  Social History Narrative   Married, male partner.  Three children.   Educ: PhD: [employer].  Relocated from [facility] 2015.   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  Occasional glass of wine.   Social Determinants of Health   Financial Resource Strain:   . Difficulty of Paying Living Expenses: Not on file  Food Insecurity:   . Worried About Charity fundraiser in the Last Year: Not on file  . Ran Out of Food in the Last Year: Not on file   Transportation Needs:   . Lack of Transportation (Medical): Not on file  . Lack of Transportation (Non-Medical): Not on file  Physical Activity:   . Days of Exercise per Week: Not on file  . Minutes of Exercise per Session: Not on file  Stress:   . Feeling of Stress : Not on file  Social Connections:   . Frequency of Communication with Friends and Family: Not on file  . Frequency of Social Gatherings with Friends and Family: Not on file  . Attends Religious Services: Not on file  . Active Member of Clubs or Organizations: Not on file  . Attends Archivist Meetings: Not on file  . Marital Status: Not on file  Intimate Partner Violence:   . Fear of Current or Ex-Partner: Not on file  . Emotionally Abused: Not on file  . Physically Abused: Not on file  . Sexually Abused: Not on file    Outpatient Medications Prior to Visit  Medication Sig Dispense Refill  . allopurinol (ZYLOPRIM) 100 MG tablet TAKE 1 TABLET(100 MG) BY MOUTH DAILY 90 tablet 0  . celecoxib (CELEBREX) 200 MG capsule 1 tab po qd prn joint pains/stiffness 30 capsule 1  . ibuprofen (ADVIL) 100 MG tablet Take  100 mg by mouth as needed for pain.    . Multiple Vitamins-Minerals (MULTIVITAMIN MEN) TABS Take by mouth daily.     No facility-administered medications prior to visit.    Allergies  Allergen Reactions  . Cheese Nausea Only    Blue cheese    ROS Review of Systems  Constitutional: Negative for appetite change, chills, fatigue and fever.  HENT: Negative for congestion, dental problem, ear pain and sore throat.   Eyes: Negative for discharge, redness and visual disturbance.  Respiratory: Negative for cough, chest tightness, shortness of breath and wheezing.   Cardiovascular: Negative for chest pain, palpitations and leg swelling.  Gastrointestinal: Negative for abdominal pain, blood in stool, diarrhea, nausea and vomiting.  Genitourinary: Negative for difficulty urinating, dysuria, flank pain,  frequency, hematuria and urgency.  Musculoskeletal: Negative for arthralgias, back pain, joint swelling, myalgias and neck stiffness.  Skin: Negative for pallor and rash.  Neurological: Negative for dizziness, speech difficulty, weakness and headaches.  Hematological: Negative for adenopathy. Does not bruise/bleed easily.  Psychiatric/Behavioral: Negative for confusion and sleep disturbance. The patient is not nervous/anxious.     PE; Vitals with BMI 01/26/2020 02/10/2019 01/02/2019  Height 6' 0.835" 6\' 0"  6\' 0"   Weight 235 lbs 220 lbs 217 lbs 13 oz  BMI 31.15 06.23 76.28  Systolic 315 176 160  Diastolic 81 81 80  Pulse 74 60 60     Gen: Alert, well appearing.  Patient is oriented to person, place, time, and situation. AFFECT: pleasant, lucid thought and speech. ENT: Ears: EACs clear, normal epithelium.  TMs with good light reflex and landmarks bilaterally.  Eyes: no injection, icteris, swelling, or exudate.  EOMI, PERRLA. Nose: no drainage or turbinate edema/swelling.  No injection or focal lesion.  Mouth: lips without lesion/swelling.  Oral mucosa pink and moist.  Dentition intact and without obvious caries or gingival swelling.  Oropharynx without erythema, exudate, or swelling.  Neck: supple/nontender.  No LAD, mass, or TM.  Carotid pulses 2+ bilaterally, without bruits. CV: RRR, no m/r/g.   LUNGS: CTA bilat, nonlabored resps, good aeration in all lung fields. ABD: soft, NT, ND, BS normal.  No hepatospenomegaly or mass.  No bruits. EXT: no clubbing, cyanosis, or edema.  Musculoskeletal: no joint swelling, erythema, warmth, or tenderness.  ROM of all joints intact. Skin - no sores or suspicious lesions or rashes or color changes   Pertinent labs:  Lab Results  Component Value Date   TSH 1.51 01/02/2019   Lab Results  Component Value Date   WBC 6.7 01/02/2019   HGB 13.4 01/02/2019   HCT 41.2 01/02/2019   MCV 82.4 01/02/2019   PLT 172.0 01/02/2019   Lab Results  Component  Value Date   CREATININE 0.97 01/02/2019   BUN 13 01/02/2019   NA 136 01/02/2019   K 3.9 01/02/2019   CL 102 01/02/2019   CO2 25 01/02/2019   Lab Results  Component Value Date   ALT 19 01/02/2019   AST 20 01/02/2019   ALKPHOS 57 01/02/2019   BILITOT 0.8 01/02/2019   Lab Results  Component Value Date   CHOL 200 01/02/2019   Lab Results  Component Value Date   HDL 37.30 (L) 01/02/2019   Lab Results  Component Value Date   LDLCALC 132 (H) 01/02/2019   Lab Results  Component Value Date   TRIG 156.0 (H) 01/02/2019   Lab Results  Component Value Date   CHOLHDL 5 01/02/2019   Lab Results  Component Value Date  LABURIC 6.2 02/10/2019    ASSESSMENT AND PLAN:   1) Gout: well controlled with 100mg  allopurinol qd + dietary adjustments. Uric acid level check today as well as renal fxn.  2) Health maintenance exam: Reviewed age and gender appropriate health maintenance issues (prudent diet, regular exercise, health risks of tobacco and excessive alcohol, use of seatbelts, fire alarms in home, use of sunscreen).  Also reviewed age and gender appropriate health screening as well as vaccine recommendations. Vaccines: Tdap UTD.  Covid 19->UTD.  Flu -->given today. Labs: fasting HP labs + uric acid (gout, overweight, and hypertriglyceridemia). Prostate ca screening: average risk patient= as per latest guidelines, start screening at 53 yrs of age. Colon ca screening: average risk patient= as per latest guidelines, start screening any time now->refer to GI now..  An After Visit Summary was printed and given to the patient.  FOLLOW UP:  Return in about 1 year (around 01/25/2021) for annual CPE (fasting).   Signed:  Crissie Sickles, MD           01/26/2020

## 2020-01-26 NOTE — Patient Instructions (Addendum)
Check your blood pressure and heart rate once a day for 2 weeks and send a message to me in MyChart with these numbers. Goal blood pressure is <130 on top and <80 on bottom.   Health Maintenance, Male Adopting a healthy lifestyle and getting preventive care are important in promoting health and wellness. Ask your health care provider about:  The right schedule for you to have regular tests and exams.  Things you can do on your own to prevent diseases and keep yourself healthy. What should I know about diet, weight, and exercise? Eat a healthy diet   Eat a diet that includes plenty of vegetables, fruits, low-fat dairy products, and lean protein.  Do not eat a lot of foods that are high in solid fats, added sugars, or sodium. Maintain a healthy weight Body mass index (BMI) is a measurement that can be used to identify possible weight problems. It estimates body fat based on height and weight. Your health care provider can help determine your BMI and help you achieve or maintain a healthy weight. Get regular exercise Get regular exercise. This is one of the most important things you can do for your health. Most adults should:  Exercise for at least 150 minutes each week. The exercise should increase your heart rate and make you sweat (moderate-intensity exercise).  Do strengthening exercises at least twice a week. This is in addition to the moderate-intensity exercise.  Spend less time sitting. Even light physical activity can be beneficial. Watch cholesterol and blood lipids Have your blood tested for lipids and cholesterol at 48 years of age, then have this test every 5 years. You may need to have your cholesterol levels checked more often if:  Your lipid or cholesterol levels are high.  You are older than 48 years of age.  You are at high risk for heart disease. What should I know about cancer screening? Many types of cancers can be detected early and may often be prevented.  Depending on your health history and family history, you may need to have cancer screening at various ages. This may include screening for:  Colorectal cancer.  Prostate cancer.  Skin cancer.  Lung cancer. What should I know about heart disease, diabetes, and high blood pressure? Blood pressure and heart disease  High blood pressure causes heart disease and increases the risk of stroke. This is more likely to develop in people who have high blood pressure readings, are of African descent, or are overweight.  Talk with your health care provider about your target blood pressure readings.  Have your blood pressure checked: ? Every 3-5 years if you are 35-69 years of age. ? Every year if you are 14 years old or older.  If you are between the ages of 30 and 21 and are a current or former smoker, ask your health care provider if you should have a one-time screening for abdominal aortic aneurysm (AAA). Diabetes Have regular diabetes screenings. This checks your fasting blood sugar level. Have the screening done:  Once every three years after age 42 if you are at a normal weight and have a low risk for diabetes.  More often and at a younger age if you are overweight or have a high risk for diabetes. What should I know about preventing infection? Hepatitis B If you have a higher risk for hepatitis B, you should be screened for this virus. Talk with your health care provider to find out if you are at risk for  hepatitis B infection. Hepatitis C Blood testing is recommended for:  Everyone born from [birth year] through 1964.  Anyone with known risk factors for hepatitis C. Sexually transmitted infections (STIs)  You should be screened each year for STIs, including gonorrhea and chlamydia, if: ? You are sexually active and are younger than 48 years of age. ? You are older than 48 years of age and your health care provider tells you that you are at risk for this type of infection. ? Your sexual  activity has changed since you were last screened, and you are at increased risk for chlamydia or gonorrhea. Ask your health care provider if you are at risk.  Ask your health care provider about whether you are at high risk for HIV. Your health care provider may recommend a prescription medicine to help prevent HIV infection. If you choose to take medicine to prevent HIV, you should first get tested for HIV. You should then be tested every 3 months for as long as you are taking the medicine. Follow these instructions at home: Lifestyle  Do not use any products that contain nicotine or tobacco, such as cigarettes, e-cigarettes, and chewing tobacco. If you need help quitting, ask your health care provider.  Do not use street drugs.  Do not share needles.  Ask your health care provider for help if you need support or information about quitting drugs. Alcohol use  Do not drink alcohol if your health care provider tells you not to drink.  If you drink alcohol: ? Limit how much you have to 0-2 drinks a day. ? Be aware of how much alcohol is in your drink. In the [place] one drink equals one 12 oz bottle of beer (355 mL), one 5 oz glass of wine (148 mL), or one 1 oz glass of hard liquor (44 mL). General instructions  Schedule regular health, dental, and eye exams.  Stay current with your vaccines.  Tell your health care provider if: ? You often feel depressed. ? You have ever been abused or do not feel safe at home. Summary  Adopting a healthy lifestyle and getting preventive care are important in promoting health and wellness.  Follow your health care provider's instructions about healthy diet, exercising, and getting tested or screened for diseases.  Follow your health care provider's instructions on monitoring your cholesterol and blood pressure. This information is not intended to replace advice given to you by your health care provider. Make sure you discuss any questions you have  with your health care provider. Document Revised: 05/04/2018 Document Reviewed: 05/04/2018 Elsevier Patient Education  2020 [employer].

## 2020-01-30 ENCOUNTER — Encounter: Payer: Self-pay | Admitting: Family Medicine

## 2020-01-30 ENCOUNTER — Other Ambulatory Visit: Payer: Self-pay | Admitting: Family Medicine

## 2020-01-30 DIAGNOSIS — E119 Type 2 diabetes mellitus without complications: Secondary | ICD-10-CM

## 2020-03-04 ENCOUNTER — Encounter: Payer: Self-pay | Admitting: Gastroenterology

## 2020-03-14 ENCOUNTER — Other Ambulatory Visit: Payer: Self-pay | Admitting: Family Medicine

## 2020-03-14 MED ORDER — ALLOPURINOL 100 MG PO TABS: ORAL_TABLET | ORAL | 1 refills | Status: DC

## 2020-04-05 ENCOUNTER — Other Ambulatory Visit: Payer: Self-pay

## 2020-04-05 ENCOUNTER — Ambulatory Visit (AMBULATORY_SURGERY_CENTER): Payer: Self-pay | Admitting: *Deleted

## 2020-04-05 VITALS — Ht 72.0 in | Wt 233.0 lb

## 2020-04-05 DIAGNOSIS — Z1211 Encounter for screening for malignant neoplasm of colon: Secondary | ICD-10-CM

## 2020-04-05 MED ORDER — SUTAB 1479-225-188 MG PO TABS: 1.0000 | ORAL_TABLET | ORAL | 0 refills | Status: DC

## 2020-04-05 NOTE — Progress Notes (Signed)
Patient is here in-person for PV. Patient denies any allergies to eggs or soy. Patient denies any past surgeries. Patient denies any oxygen use at home. Patient denies taking any diet/weight loss medications or blood thinners. Patient is not being treated for MRSA or C-diff. Patient is aware of our care-partner policy and COBTV-49 safety protocol. EMMI education assigned to the patient for the procedure, sent to [facility].   COVID-19 vaccines completed on 02/2020 x3 booster, per patient.   Prep Prescription coupon given to the patient.

## 2020-04-08 DIAGNOSIS — Z20822 Contact with and (suspected) exposure to covid-19: Secondary | ICD-10-CM | POA: Diagnosis not present

## 2020-04-09 ENCOUNTER — Encounter: Payer: Self-pay | Admitting: Gastroenterology

## 2020-04-24 HISTORY — PX: COLONOSCOPY: SHX174

## 2020-04-30 ENCOUNTER — Ambulatory Visit (AMBULATORY_SURGERY_CENTER): Payer: BC Managed Care – PPO | Admitting: Gastroenterology

## 2020-04-30 ENCOUNTER — Other Ambulatory Visit: Payer: Self-pay

## 2020-04-30 ENCOUNTER — Encounter: Payer: Self-pay | Admitting: Gastroenterology

## 2020-04-30 VITALS — BP 115/72 | HR 66 | Temp 96.6°F | Resp 15

## 2020-04-30 DIAGNOSIS — Z1211 Encounter for screening for malignant neoplasm of colon: Secondary | ICD-10-CM

## 2020-04-30 DIAGNOSIS — D123 Benign neoplasm of transverse colon: Secondary | ICD-10-CM

## 2020-04-30 DIAGNOSIS — K635 Polyp of colon: Secondary | ICD-10-CM

## 2020-04-30 DIAGNOSIS — K640 First degree hemorrhoids: Secondary | ICD-10-CM

## 2020-04-30 DIAGNOSIS — D125 Benign neoplasm of sigmoid colon: Secondary | ICD-10-CM

## 2020-04-30 MED ORDER — SODIUM CHLORIDE 0.9 % IV SOLN: 500.0000 mL | Freq: Once | INTRAVENOUS | Status: DC

## 2020-04-30 NOTE — Op Note (Signed)
[facility] Patient Name: Austin Sanchez Procedure Date: 04/30/2020 3:39 PM MRN: [id number] Endoscopist: Gerrit Heck , MD Age: 49 Referring MD:  Date of Birth: 07-14-1970 Gender: Male Account #: [id number] Procedure:                Colonoscopy Indications:              Screening for colorectal malignant neoplasm, This                            is the patient's first colonoscopy Medicines:                Monitored Anesthesia Care Procedure:                Pre-Anesthesia Assessment:                           - Prior to the procedure, a History and Physical                            was performed, and patient medications and                            allergies were reviewed. The patient's tolerance of                            previous anesthesia was also reviewed. The risks                            and benefits of the procedure and the sedation                            options and risks were discussed with the patient.                            All questions were answered, and informed consent                            was obtained. Prior Anticoagulants: The patient has                            taken no previous anticoagulant or antiplatelet                            agents. ASA Grade Assessment: II - A patient with                            mild systemic disease. After reviewing the risks                            and benefits, the patient was deemed in                            satisfactory condition to undergo the procedure.  After obtaining informed consent, the colonoscope                            was passed under direct vision. Throughout the                            procedure, the patient's blood pressure, pulse, and                            oxygen saturations were monitored continuously. The                            Colonoscope was introduced through the anus and                            advanced to the the  terminal ileum. The colonoscopy                            was performed without difficulty. The patient                            tolerated the procedure well. The quality of the                            bowel preparation was excellent. The terminal                            ileum, ileocecal valve, appendiceal orifice, and                            rectum were photographed. Scope In: 3:47:54 PM Scope Out: 4:03:39 PM Scope Withdrawal Time: 0 hours 10 minutes 46 seconds  Total Procedure Duration: 0 hours 15 minutes 45 seconds  Findings:                 The perianal and digital rectal examinations were                            normal.                           Two sessile polyps were found in the sigmoid colon                            and transverse colon. The polyps were 2 to 4 mm in                            size. These polyps were removed with a cold snare.                            Resection and retrieval were complete. Estimated                            blood loss was minimal.  Non-bleeding internal hemorrhoids were found during                            retroflexion. The hemorrhoids were small.                           The terminal ileum appeared normal. Complications:            No immediate complications. Estimated Blood Loss:     Estimated blood loss was minimal. Impression:               - Two 2 to 4 mm polyps in the sigmoid colon and in                            the transverse colon, removed with a cold snare.                            Resected and retrieved.                           - Non-bleeding internal hemorrhoids.                           - The examined portion of the ileum was normal. Recommendation:           - Patient has a contact number available for                            emergencies. The signs and symptoms of potential                            delayed complications were discussed with the                             patient. Return to normal activities tomorrow.                            Written discharge instructions were provided to the                            patient.                           - Resume previous diet.                           - Continue present medications.                           - Await pathology results.                           - Repeat colonoscopy in 5-10 years for surveillance                            based on pathology results.                           -  Return to GI office PRN. Gerrit Heck, MD 04/30/2020 4:08:23 PM

## 2020-04-30 NOTE — Progress Notes (Signed)
Vs in adm by Loel Ro LPN

## 2020-04-30 NOTE — Patient Instructions (Signed)
Handouts given:  Hemorrhoids, Polyps Resume previous diet Continue current medications Await pathology results  YOU HAD AN ENDOSCOPIC PROCEDURE TODAY AT THE [facility]:   Refer to the procedure report that was given to you for any specific questions about what was found during the examination.  If the procedure report does not answer your questions, please call your gastroenterologist to clarify.  If you requested that your care partner not be given the details of your procedure findings, then the procedure report has been included in a sealed envelope for you to review at your convenience later.  YOU SHOULD EXPECT: Some feelings of bloating in the abdomen. Passage of more gas than usual.  Walking can help get rid of the air that was put into your GI tract during the procedure and reduce the bloating. If you had a lower endoscopy (such as a colonoscopy or flexible sigmoidoscopy) you may notice spotting of blood in your stool or on the toilet paper. If you underwent a bowel prep for your procedure, you may not have a normal bowel movement for a few days.  Please Note:  You might notice some irritation and congestion in your nose or some drainage.  This is from the oxygen used during your procedure.  There is no need for concern and it should clear up in a day or so.  SYMPTOMS TO REPORT IMMEDIATELY:   Following lower endoscopy (colonoscopy or flexible sigmoidoscopy):  Excessive amounts of blood in the stool  Significant tenderness or worsening of abdominal pains  Swelling of the abdomen that is new, acute  Fever of 100F or higher  For urgent or emergent issues, a gastroenterologist can be reached at any hour by calling [phone]. Do not use MyChart messaging for urgent concerns.   DIET:  We do recommend a small meal at first, but then you may proceed to your regular diet.  Drink plenty of fluids but you should avoid alcoholic beverages for 24 hours.  ACTIVITY:  You should  plan to take it easy for the rest of today and you should NOT DRIVE or use heavy machinery until tomorrow (because of the sedation medicines used during the test).    FOLLOW UP: Our staff will call the number listed on your records 48-72 hours following your procedure to check on you and address any questions or concerns that you may have regarding the information given to you following your procedure. If we do not reach you, we will leave a message.  We will attempt to reach you two times.  During this call, we will ask if you have developed any symptoms of COVID 19. If you develop any symptoms (ie: fever, flu-like symptoms, shortness of breath, cough etc.) before then, please call [phone].  If you test positive for Covid 19 in the 2 weeks post procedure, please call and report this information to us.    If any biopsies were taken you will be contacted by phone or by letter within the next 1-3 weeks.  Please call us at [phone] if you have not heard about the biopsies in 3 weeks.   SIGNATURES/CONFIDENTIALITY: You and/or your care partner have signed paperwork which will be entered into your electronic medical record.  These signatures attest to the fact that that the information above on your After Visit Summary has been reviewed and is understood.  Full responsibility of the confidentiality of this discharge information lies with you and/or your care-partner. 

## 2020-04-30 NOTE — Progress Notes (Signed)
Pt's states no medical or surgical changes since previsit or office visit. 

## 2020-04-30 NOTE — Progress Notes (Signed)
pt tolerated well. VSS. awake and to recovery. Report given to RN.  

## 2020-05-02 ENCOUNTER — Telehealth: Payer: Self-pay

## 2020-05-02 NOTE — Telephone Encounter (Signed)
  Follow up Call-  Call back number 04/30/2020  Post procedure Call Back phone  # [phone]-  Permission to leave phone message Yes  Some recent data might be hidden     Patient questions:  Do you have a fever, pain , or abdominal swelling? No. Pain Score  0 *  Have you tolerated food without any problems? Yes.    Have you been able to return to your normal activities? Yes.    Do you have any questions about your discharge instructions: Diet   No. Medications  No. Follow up visit  No.  Do you have questions or concerns about your Care? No.  Actions: * If pain score is 4 or above: 1. No action needed, pain <4.Have you developed a fever since your procedure? no  2.   Have you had an respiratory symptoms (SOB or cough) since your procedure? no  3.   Have you tested positive for COVID 19 since your procedure no  4.   Have you had any family members/close contacts diagnosed with the COVID 19 since your procedure?  no   If yes to any of these questions please route to Joylene John, RN and Joella Prince, RN

## 2020-05-03 ENCOUNTER — Ambulatory Visit: Payer: BC Managed Care – PPO | Admitting: Family Medicine

## 2020-05-06 ENCOUNTER — Telehealth (INDEPENDENT_AMBULATORY_CARE_PROVIDER_SITE_OTHER): Payer: BC Managed Care – PPO | Admitting: Family Medicine

## 2020-05-06 ENCOUNTER — Telehealth: Payer: Self-pay

## 2020-05-06 ENCOUNTER — Encounter: Payer: Self-pay | Admitting: Family Medicine

## 2020-05-06 ENCOUNTER — Ambulatory Visit: Payer: BC Managed Care – PPO | Admitting: Family Medicine

## 2020-05-06 DIAGNOSIS — E7800 Pure hypercholesterolemia, unspecified: Secondary | ICD-10-CM

## 2020-05-06 DIAGNOSIS — Z86010 Personal history of colonic polyps: Secondary | ICD-10-CM

## 2020-05-06 DIAGNOSIS — M109 Gout, unspecified: Secondary | ICD-10-CM | POA: Diagnosis not present

## 2020-05-06 NOTE — Telephone Encounter (Signed)
Per PCP, patient needs a non-fasting lab appt at his earliest convenience-- labs will be entered. LVM for patient to return call to schedule.

## 2020-05-06 NOTE — Progress Notes (Signed)
Virtual Visit via Video Note  I connected with pt on 05/06/20 at 11:30 AM EST by a video enabled telemedicine application and verified that I am speaking with the correct person using two identifiers.  Location patient: home, [place] Location provider:work or home office Persons participating in the virtual visit: patient, provider  I discussed the limitations of evaluation and management by telemedicine and the availability of in person appointments. The patient expressed understanding and agreed to proceed.   HPI: 49 y/o male being seen today for 3 mo f/u gout and mixed hyperlipidemia, discuss colonoscopy results.  On 04/30/20 he had a screening colonoscopy and 2 small polyps were resected, some int hemorrhoids noted, o/w normal.  Path showed sessile serrated polyp x 1.   LDL was 123 three mo ago. Ten yr Frhm CV risk=about 4-5%.  He feels well, says no joint pains at all, taking allopurinol 100mg  daily. Is trying to eat healthy but denies any major diet changes in the last 3 mo. We discussed the use of statins for HLD, esp for pt's over age 32 with DM. He mentioned some apprehension about this class of meds b/c says his father had statin-induced anemia, although pt doesn't have any details about this.  ROS: no fevers, no CP, no SOB, no wheezing, no cough, no dizziness, no HAs, no rashes, no melena/hematochezia.  No polyuria or polydipsia.  No myalgias or arthralgias.  No focal weakness, paresthesias, or tremors.  No acute vision or hearing abnormalities. No n/v/d or abd pain.  No palpitations.    Past Medical History:  Diagnosis Date  . Gout 2019/2020  . History of adenomatous polyp of colon   . Hypertriglyceridemia 2014   pt denies on 04/30/20    Past Surgical History:  Procedure Laterality Date  . COLONOSCOPY  04/2020   adenoma x 1, recall 04/2025     Current Outpatient Medications:  .  allopurinol (ZYLOPRIM) 100 MG tablet, TAKE 1 TABLET(100 MG) BY MOUTH DAILY, Disp: 90 tablet,  Rfl: 1 .  ibuprofen (ADVIL) 100 MG tablet, Take 100 mg by mouth as needed for pain. (Patient not taking: Reported on 05/06/2020), Disp: , Rfl:  .  Multiple Vitamins-Minerals (MULTIVITAMIN MEN) TABS, Take by mouth daily. (Patient not taking: No sig reported), Disp: , Rfl:   EXAM:  VITALS per patient if applicable:  Vitals with BMI 04/30/2020 04/30/2020 04/30/2020  Height - - -  Weight - - -  BMI - - -  Systolic 376 283 151  Diastolic 72 69 69  Pulse 66 63 66     GENERAL: alert, oriented, appears well and in no acute distress  HEENT: atraumatic, conjunttiva clear, no obvious abnormalities on inspection of external nose and ears  NECK: normal movements of the head and neck  LUNGS: on inspection no signs of respiratory distress, breathing rate appears normal, no obvious gross SOB, gasping or wheezing  CV: no obvious cyanosis  MS: moves all visible extremities without noticeable abnormality  PSYCH/NEURO: pleasant and cooperative, no obvious depression or anxiety, speech and thought processing grossly intact  LABS: none today  Lab Results  Component Value Date   TSH 2.29 01/26/2020   Lab Results  Component Value Date   WBC 6.9 01/26/2020   HGB 13.6 01/26/2020   HCT 42.6 01/26/2020   MCV 82.5 01/26/2020   PLT 198.0 01/26/2020   Lab Results  Component Value Date   CREATININE 1.11 01/26/2020   BUN 15 01/26/2020   NA 138 01/26/2020   K  4.3 01/26/2020   CL 103 01/26/2020   CO2 28 01/26/2020   Lab Results  Component Value Date   ALT 24 01/26/2020   AST 24 01/26/2020   ALKPHOS 55 01/26/2020   BILITOT 0.7 01/26/2020   Lab Results  Component Value Date   CHOL 199 01/26/2020   Lab Results  Component Value Date   HDL 37.80 (L) 01/26/2020   Lab Results  Component Value Date   LDLCALC 123 (H) 01/26/2020   Lab Results  Component Value Date   TRIG 188.0 (H) 01/26/2020   Lab Results  Component Value Date   CHOLHDL 5 01/26/2020   Lab Results  Component Value  Date   LABURIC 6.6 01/26/2020    ASSESSMENT AND PLAN:  Discussed the following assessment and plan:  1) HLD, mixed.  Mild. 10 yr Frmhm cv risk= approx 4-5%. NO med indicated at this time. TLC. F/u 3 mo with fasting lipid panel the week prior.  2) Gout: no probs. Doing well on allopurinol 100 mg qd. No changes.  I discussed the assessment and treatment plan with the patient. The patient was provided an opportunity to ask questions and all were answered. The patient agreed with the plan and demonstrated an understanding of the instructions.   F/u: 3 mo f/u HLD  Signed:  Crissie Sickles, MD           05/06/2020

## 2020-05-07 ENCOUNTER — Encounter: Payer: Self-pay | Admitting: Gastroenterology

## 2020-05-08 NOTE — Telephone Encounter (Signed)
Called patient. Scheduled fasting lab appt 08/24/2019

## 2020-08-11 DIAGNOSIS — R300 Dysuria: Secondary | ICD-10-CM | POA: Diagnosis not present

## 2020-08-11 DIAGNOSIS — K649 Unspecified hemorrhoids: Secondary | ICD-10-CM | POA: Diagnosis not present

## 2020-08-12 ENCOUNTER — Ambulatory Visit (INDEPENDENT_AMBULATORY_CARE_PROVIDER_SITE_OTHER): Payer: BC Managed Care – PPO | Admitting: Family Medicine

## 2020-08-12 ENCOUNTER — Other Ambulatory Visit: Payer: Self-pay

## 2020-08-12 ENCOUNTER — Encounter: Payer: Self-pay | Admitting: Family Medicine

## 2020-08-12 VITALS — BP 130/90 | HR 58 | Temp 97.5°F | Resp 16 | Ht 72.84 in | Wt 233.2 lb

## 2020-08-12 DIAGNOSIS — K602 Anal fissure, unspecified: Secondary | ICD-10-CM | POA: Diagnosis not present

## 2020-08-12 DIAGNOSIS — K6289 Other specified diseases of anus and rectum: Secondary | ICD-10-CM | POA: Diagnosis not present

## 2020-08-12 DIAGNOSIS — R300 Dysuria: Secondary | ICD-10-CM

## 2020-08-12 LAB — POCT URINALYSIS DIPSTICK
Bilirubin, UA: NEGATIVE
Blood, UA: NEGATIVE
Glucose, UA: NEGATIVE
Ketones, UA: NEGATIVE
Leukocytes, UA: NEGATIVE
Nitrite, UA: POSITIVE
Protein, UA: NEGATIVE
Spec Grav, UA: 1.02 (ref 1.010–1.025)
Urobilinogen, UA: 0.2 E.U./dL
pH, UA: 6 (ref 5.0–8.0)

## 2020-08-12 MED ORDER — SULFAMETHOXAZOLE-TRIMETHOPRIM 800-160 MG PO TABS: 1.0000 | ORAL_TABLET | Freq: Two times a day (BID) | ORAL | 0 refills | Status: AC

## 2020-08-12 MED ORDER — DILTIAZEM GEL 2 %: CUTANEOUS | 3 refills | Status: DC

## 2020-08-12 NOTE — Progress Notes (Signed)
OFFICE VISIT  08/12/2020  CC:  Chief Complaint  Patient presents with  . Possible hemorrhoids    Severe pain, no blood in stool. Pulling pain in anal sphincter, burning sensation even with urination.  Occurring for the last 4 days    HPI:    Patient is a 50 y.o. male who presents for "pulling" pain in anal area. Onset 4-5 d/a, was constipated and stool was diff to pass, felt tenderness in anal opening "like a hemorrhoid pain".  Blood on toilet tissue a bit a couple days ago but none since.  Anusol HC rx'd yesterday by [facility]. Burning pain in penis with urination lately as well.  No urinary urgency or frequency. Some vague pulling pain in anal/GU/testicular area. No abd pain, nausea, fever, or diarrhea.   On 04/30/20 he had a screening colonoscopy and 2 small polyps were resected, some int hemorrhoids noted, o/w normal.  Path showed sessile serrated polyp x 1.  Past Medical History:  Diagnosis Date  . Gout 2019/2020  . History of adenomatous polyp of colon   . Hypertriglyceridemia 2014   pt denies on 04/30/20    Past Surgical History:  Procedure Laterality Date  . COLONOSCOPY  04/2020   adenoma x 1, recall 04/2025    Outpatient Medications Prior to Visit  Medication Sig Dispense Refill  . allopurinol (ZYLOPRIM) 100 MG tablet TAKE 1 TABLET(100 MG) BY MOUTH DAILY 90 tablet 1  . hydrocortisone-pramoxine (ANALPRAM-HC) 2.5-1 % rectal cream     . Multiple Vitamins-Minerals (MULTIVITAMIN MEN) TABS Take by mouth daily.    Marland Kitchen ibuprofen (ADVIL) 100 MG tablet Take 100 mg by mouth as needed for pain. (Patient not taking: No sig reported)     No facility-administered medications prior to visit.    Allergies  Allergen Reactions  . Cheese Nausea Only    Blue cheese    ROS As per HPI  PE: Vitals with BMI 08/12/2020 04/30/2020 04/30/2020  Height 6' 0.835" - -  Weight 233 lbs 3 oz - -  BMI 09.98 - -  Systolic 338 250 539  Diastolic 90 72 69  Pulse 58 66 63     Gen: Alert,  well appearing.  Patient is oriented to person, place, time, and situation. AFFECT: pleasant, lucid thought and speech. Anal exam: question of midline fissure but pt discomfort with exam prevented fully adequate exam.  No external hemorrhoids, no masses or nodules or asymmetry to suggest abscess.   Digital exam too uncomfortable to fully complete--I never reached the prostate gland.  LABS:    Chemistry      Component Value Date/Time   NA 138 01/26/2020 1145   K 4.3 01/26/2020 1145   CL 103 01/26/2020 1145   CO2 28 01/26/2020 1145   BUN 15 01/26/2020 1145   CREATININE 1.11 01/26/2020 1145      Component Value Date/Time   CALCIUM 9.2 01/26/2020 1145   ALKPHOS 55 01/26/2020 1145   AST 24 01/26/2020 1145   ALT 24 01/26/2020 1145   BILITOT 0.7 01/26/2020 1145     POC CC dipstick UA today: nitrite positive, otherwise NORMAL  IMPRESSION AND PLAN:  Anal pain.  I think this is coming primarily from anal fissure causing sphincter spasm, but could not confidently confirm one on exam today.  Given his dysuria and +nitrite on UA I can't discount possible UTI/prostatitis. Will empirically treat with diltiazem 2% gel qid to anal area and keep stool soft with colace. Also, will treat with bactrim ds  1 bid x 14d for prostatitis.  Sent urine for c/s.  An After Visit Summary was printed and given to the patient.  FOLLOW UP: Return if symptoms worsen or fail to improve.  Signed:  Crissie Sickles, MD           08/12/2020

## 2020-08-13 LAB — URINE CULTURE
MICRO NUMBER:: 11671826
Result:: NO GROWTH
SPECIMEN QUALITY:: ADEQUATE

## 2020-08-16 ENCOUNTER — Telehealth: Payer: Self-pay | Admitting: Family Medicine

## 2020-08-16 MED ORDER — HYDROCODONE-ACETAMINOPHEN 5-325 MG PO TABS: 1.0000 | ORAL_TABLET | Freq: Four times a day (QID) | ORAL | 0 refills | Status: DC | PRN

## 2020-08-16 NOTE — Telephone Encounter (Signed)
Spoke with pt regarding rx recommendations. He asked if he needed to stop taking the colace and start using the Senakot S tabs instead. Advised he could continue taking the colace while on hydrocodone but if he finds this is not helpful then he can switch to the [place] tabs instead. Pt voiced understanding

## 2020-08-16 NOTE — Telephone Encounter (Signed)
Patient came in recently about GI concerns, He states that his lab cul came back negative. He was prescribed antibiotics but the pain is still there and would like to know if pain meds can be called in for him. He is still having burning, and pain in Pelvic. PT will be traveling out of town today and would like meds before he leaves. Pt also mentioned to have tried over the counter meds  which has not helped

## 2020-08-16 NOTE — Telephone Encounter (Signed)
OK, hydrocodone eRx'd but make sure he knows that this med can cause constipation and drowsiness. Might want to take otc generic for senakot S while on this med.

## 2020-08-16 NOTE — Telephone Encounter (Signed)
Pt was last seen 08/12/20, mentioned in note "Will empirically treat with diltiazem 2% gel qid to anal area and keep stool soft with colace. Also, will treat with bactrim ds 1 bid x 14d for prostatitis.  Sent urine for c/s"   Please advise, thanks.

## 2020-08-16 NOTE — Telephone Encounter (Signed)
LM for pt to return call regarding rx.  

## 2020-08-23 ENCOUNTER — Ambulatory Visit (INDEPENDENT_AMBULATORY_CARE_PROVIDER_SITE_OTHER): Payer: BC Managed Care – PPO

## 2020-08-23 ENCOUNTER — Other Ambulatory Visit: Payer: Self-pay

## 2020-08-23 ENCOUNTER — Telehealth: Payer: Self-pay | Admitting: Family Medicine

## 2020-08-23 DIAGNOSIS — E7800 Pure hypercholesterolemia, unspecified: Secondary | ICD-10-CM

## 2020-08-23 LAB — LIPID PANEL
Cholesterol: 186 mg/dL (ref 0–200)
HDL: 35.2 mg/dL — ABNORMAL LOW (ref >39.00)
LDL Cholesterol: 116 mg/dL — ABNORMAL HIGH (ref 0–99)
NonHDL: 150.76
Total CHOL/HDL Ratio: 5
Triglycerides: 173 mg/dL — ABNORMAL HIGH (ref 0.0–149.0)
VLDL: 34.6 mg/dL (ref 0.0–40.0)

## 2020-08-23 NOTE — Telephone Encounter (Signed)
Spoke with pt and he continued colace, has not tried otc generic for senakot S because he could not recall the name of med. Please advise if any other recommendations.  Tammi Sou, MD   08/16/20 10:31 AM Note OK, hydrocodone eRx'd but make sure he knows that this med can cause constipation and drowsiness. Might want to take otc generic for senakot S while on this med.

## 2020-08-23 NOTE — Telephone Encounter (Signed)
Patient states he is still having constipation from hydrocodone. He would like advise on which OTC medication is best for this problem. Please call patient to advise.

## 2020-08-23 NOTE — Telephone Encounter (Signed)
Spoke with pt regarding additional med recommendations.

## 2020-08-23 NOTE — Telephone Encounter (Signed)
Start the senakot as previously recommended but also can add miralax powder 1 capful twice a day.

## 2020-08-30 ENCOUNTER — Ambulatory Visit (INDEPENDENT_AMBULATORY_CARE_PROVIDER_SITE_OTHER): Payer: BC Managed Care – PPO | Admitting: Family Medicine

## 2020-08-30 ENCOUNTER — Encounter: Payer: Self-pay | Admitting: Family Medicine

## 2020-08-30 ENCOUNTER — Other Ambulatory Visit: Payer: Self-pay

## 2020-08-30 VITALS — BP 118/72 | HR 62 | Temp 97.5°F | Resp 16 | Ht 72.84 in | Wt 235.0 lb

## 2020-08-30 DIAGNOSIS — L209 Atopic dermatitis, unspecified: Secondary | ICD-10-CM

## 2020-08-30 DIAGNOSIS — E782 Mixed hyperlipidemia: Secondary | ICD-10-CM | POA: Diagnosis not present

## 2020-08-30 DIAGNOSIS — K6289 Other specified diseases of anus and rectum: Secondary | ICD-10-CM | POA: Diagnosis not present

## 2020-08-30 NOTE — Progress Notes (Signed)
OFFICE VISIT  08/30/2020  CC:  Chief Complaint  Patient presents with  . Follow-up    RCI, not fasting    HPI:    Patient is a 50 y.o. male who presents for f/u HLD as well as anal pain.  LDL was 123, HDL 38, trigs 188 back in Sept 2021,  Ten yr Frhm CV risk=about 4-5%. TLC was discussed and plan was to repeat lipids 6 mo-->this was done 1 wk ago and is about the same: LDL 116, HDL 35, trigs 173.  Also saw him for anal pain about 2 and 1/2 wks ago, dx suspected anal fissure and possible acute prostatitis-->rx'd diltiazem cream as well as bactrim x 2 wks. Sx's persisted for a while, required some hydrocodone, did not seem to respond to dilt cream.  Then he started using colace more and adjusting diet to higher fiber and says sx's eventually resolved.  As he has backed down on colace again and less fiber in diet last couple days he notes slight return of "spasm" anal pain when passing a BM. BMs not hard or excessively large.  No blood or pus in stool.  No fecal urgency. No more urinary symptoms except vague sense of "heat" sometimes when urinating during times when anal pain occurs---but specifically denies burning with urination, urinary urgency, or frequency.  Of note, he stopped his allopurinol a couple weeks ago when I started him on meds for his anal pain--not really sure why, though.   No gout pain during this time.   Past Medical History:  Diagnosis Date  . Gout 2019/2020  . History of adenomatous polyp of colon   . Hypertriglyceridemia 2014   pt denies on 04/30/20    Past Surgical History:  Procedure Laterality Date  . COLONOSCOPY  04/2020   adenoma x 1, recall 04/2025    Outpatient Medications Prior to Visit  Medication Sig Dispense Refill  . allopurinol (ZYLOPRIM) 100 MG tablet TAKE 1 TABLET(100 MG) BY MOUTH DAILY (Patient not taking: Reported on 08/30/2020) 90 tablet 1  . diltiazem 2 % GEL Apply qid prn anal pain (Patient not taking: Reported on 08/30/2020) 30 g 3  .  HYDROcodone-acetaminophen (NORCO/VICODIN) 5-325 MG tablet Take 1 tablet by mouth every 6 (six) hours as needed for moderate pain. (Patient not taking: Reported on 08/30/2020) 20 tablet 0  . hydrocortisone-pramoxine (ANALPRAM-HC) 2.5-1 % rectal cream  (Patient not taking: Reported on 08/30/2020)    . ibuprofen (ADVIL) 100 MG tablet Take 100 mg by mouth as needed for pain. (Patient not taking: No sig reported)    . Multiple Vitamins-Minerals (MULTIVITAMIN MEN) TABS Take by mouth daily. (Patient not taking: Reported on 08/30/2020)     No facility-administered medications prior to visit.    Allergies  Allergen Reactions  . Cheese Nausea Only    Blue cheese    ROS As per HPI  PE: Vitals with BMI 08/30/2020 08/12/2020 04/30/2020  Height 6' 0.84" 6' 0.835" -  Weight 235 lbs 233 lbs 3 oz -  BMI 01.74 94.49 -  Systolic 675 916 384  Diastolic 72 90 72  Pulse 62 58 66   Gen: Alert, well appearing.  Patient is oriented to person, place, time, and situation. AFFECT: pleasant, lucid thought and speech.  No further exam today.  LABS:   Lab Results  Component Value Date   LABURIC 6.6 01/26/2020      Chemistry      Component Value Date/Time   NA 138 01/26/2020 1145  K 4.3 01/26/2020 1145   CL 103 01/26/2020 1145   CO2 28 01/26/2020 1145   BUN 15 01/26/2020 1145   CREATININE 1.11 01/26/2020 1145      Component Value Date/Time   CALCIUM 9.2 01/26/2020 1145   ALKPHOS 55 01/26/2020 1145   AST 24 01/26/2020 1145   ALT 24 01/26/2020 1145   BILITOT 0.7 01/26/2020 1145     Lab Results  Component Value Date   CHOL 186 08/23/2020   HDL 35.20 (L) 08/23/2020   LDLCALC 116 (H) 08/23/2020   TRIG 173.0 (H) 08/23/2020   CHOLHDL 5 08/23/2020   IMPRESSION AND PLAN:  1) Anal pain, functional. Cont dietary adjustments and colace. His intermittent urinary discomfort is likely related. Reassuringly he had a normal colonoscopy 04/2020 other than 1 polyp and some internal hemorrhoids. No  specialist referral at this time.  He is in agreement with this plan. He may continue to try the diltiazem cream intermittently.  2) HLD, borderline. Gradually declining trigs and LDL last few years. Continue TLC.  3) Atopic dermatitis: at end of visit today he asked for derm referral for longstanding pretibial region atopic dermatitis that has apparently never responded to rx topical steroids from multiple MDs (including dermatologists) in the past. He has heard about approval of a new AD injection therapy. Derm referral ordered today.  Spent 23 min with pt today reviewing HPI, reviewing relevant past history, doing exam, reviewing and discussing lab and imaging data, and formulating plans.  An After Visit Summary was printed and given to the patient.  FOLLOW UP: Return in about 5 months (around 01/30/2021) for annual CPE (fasting).  Signed:  Crissie Sickles, MD           08/30/2020

## 2020-11-07 ENCOUNTER — Other Ambulatory Visit: Payer: Self-pay | Admitting: Family Medicine

## 2021-02-14 ENCOUNTER — Other Ambulatory Visit: Payer: Self-pay

## 2021-02-14 MED ORDER — ALLOPURINOL 100 MG PO TABS: ORAL_TABLET | ORAL | 0 refills | Status: DC

## 2021-07-21 ENCOUNTER — Other Ambulatory Visit: Payer: Self-pay

## 2021-07-21 ENCOUNTER — Ambulatory Visit (INDEPENDENT_AMBULATORY_CARE_PROVIDER_SITE_OTHER): Payer: BC Managed Care – PPO | Admitting: Family Medicine

## 2021-07-21 ENCOUNTER — Encounter: Payer: Self-pay | Admitting: Family Medicine

## 2021-07-21 VITALS — BP 127/76 | HR 60 | Temp 97.6°F | Ht 73.0 in | Wt 237.2 lb

## 2021-07-21 DIAGNOSIS — M1000 Idiopathic gout, unspecified site: Secondary | ICD-10-CM

## 2021-07-21 DIAGNOSIS — Z1159 Encounter for screening for other viral diseases: Secondary | ICD-10-CM

## 2021-07-21 DIAGNOSIS — R21 Rash and other nonspecific skin eruption: Secondary | ICD-10-CM

## 2021-07-21 DIAGNOSIS — Z125 Encounter for screening for malignant neoplasm of prostate: Secondary | ICD-10-CM

## 2021-07-21 DIAGNOSIS — Z0000 Encounter for general adult medical examination without abnormal findings: Secondary | ICD-10-CM | POA: Diagnosis not present

## 2021-07-21 DIAGNOSIS — E782 Mixed hyperlipidemia: Secondary | ICD-10-CM | POA: Diagnosis not present

## 2021-07-21 LAB — LIPID PANEL
Cholesterol: 206 mg/dL — ABNORMAL HIGH (ref 0–200)
HDL: 39.5 mg/dL (ref >39.00)
LDL Cholesterol: 131 mg/dL — ABNORMAL HIGH (ref 0–99)
NonHDL: 166.3
Total CHOL/HDL Ratio: 5
Triglycerides: 179 mg/dL — ABNORMAL HIGH (ref 0.0–149.0)
VLDL: 35.8 mg/dL (ref 0.0–40.0)

## 2021-07-21 LAB — CBC WITH DIFFERENTIAL/PLATELET
Basophils Absolute: 0.1 10*3/uL (ref 0.0–0.1)
Basophils Relative: 0.8 % (ref 0.0–3.0)
Eosinophils Absolute: 0.3 10*3/uL (ref 0.0–0.7)
Eosinophils Relative: 3.7 % (ref 0.0–5.0)
HCT: 41.9 % (ref 39.0–52.0)
Hemoglobin: 13.4 g/dL (ref 13.0–17.0)
Lymphocytes Relative: 33.4 % (ref 12.0–46.0)
Lymphs Abs: 2.4 10*3/uL (ref 0.7–4.0)
MCHC: 31.9 g/dL (ref 30.0–36.0)
MCV: 80.8 fl (ref 78.0–100.0)
Monocytes Absolute: 0.5 10*3/uL (ref 0.1–1.0)
Monocytes Relative: 6.8 % (ref 3.0–12.0)
Neutro Abs: 4 10*3/uL (ref 1.4–7.7)
Neutrophils Relative %: 55.3 % (ref 43.0–77.0)
Platelets: 210 10*3/uL (ref 150.0–400.0)
RBC: 5.19 Mil/uL (ref 4.22–5.81)
RDW: 14.4 % (ref 11.5–15.5)
WBC: 7.3 10*3/uL (ref 4.0–10.5)

## 2021-07-21 LAB — COMPREHENSIVE METABOLIC PANEL
ALT: 32 U/L (ref 0–53)
AST: 27 U/L (ref 0–37)
Albumin: 4.4 g/dL (ref 3.5–5.2)
Alkaline Phosphatase: 53 U/L (ref 39–117)
BUN: 15 mg/dL (ref 6–23)
CO2: 27 mEq/L (ref 19–32)
Calcium: 9.3 mg/dL (ref 8.4–10.5)
Chloride: 100 mEq/L (ref 96–112)
Creatinine, Ser: 1.1 mg/dL (ref 0.40–1.50)
GFR: 78.83 mL/min (ref >60.00)
Glucose, Bld: 88 mg/dL (ref 70–99)
Potassium: 4.1 mEq/L (ref 3.5–5.1)
Sodium: 136 mEq/L (ref 135–145)
Total Bilirubin: 0.7 mg/dL (ref 0.2–1.2)
Total Protein: 7.5 g/dL (ref 6.0–8.3)

## 2021-07-21 LAB — URIC ACID: Uric Acid, Serum: 6.1 mg/dL (ref 4.0–7.8)

## 2021-07-21 MED ORDER — ALLOPURINOL 100 MG PO TABS: ORAL_TABLET | ORAL | 3 refills | Status: DC

## 2021-07-21 NOTE — Patient Instructions (Signed)
Low-Purine Eating Plan A low-purine eating plan involves making food choices to limit your intake of purine. Purine is a kind of uric acid. Too much uric acid in your blood can cause certain conditions, such as gout and kidney stones. Eating a low-purinediet can help control these conditions. What are tips for following this plan? Reading food labels Avoid foods with saturated or Trans fat. Check the ingredient list of grains-based foods, such as bread and cereal, to make sure that they contain whole grains. Check the ingredient list of sauces or soups to make sure they do not contain meat or fish. When choosing soft drinks, check the ingredient list to make sure they do not contain high-fructose corn syrup. Shopping  Buy plenty of fresh fruits and vegetables. Avoid buying canned or fresh fish. Buy dairy products labeled as low-fat or nonfat. Avoid buying premade or processed foods. These foods are often high in fat, salt (sodium), and added sugar.  Cooking Use olive oil instead of butter when cooking. Oils like olive oil, canola oil, and sunflower oil contain healthy fats. Meal planning Learn which foods do or do not affect you. If you find out that a food tends to cause your gout symptoms to flare up, avoid eating that food. You can enjoy foods that do not cause problems. If you have any questions about a food item, talk with your dietitian or health care provider. Limit foods high in fat, especially saturated fat. Fat makes it harder for your body to get rid of uric acid. Choose foods that are lower in fat and are lean sources of protein. General guidelines Limit alcohol intake to no more than 1 drink a day for nonpregnant women and 2 drinks a day for men. One drink equals 12 oz of beer, 5 oz of wine, or 1 oz of hard liquor. Alcohol can affect the way your body gets rid of uric acid. Drink plenty of water to keep your urine clear or pale yellow. Fluids can help remove uric acid from your  body. If directed by your health care provider, take a vitamin C supplement. Work with your health care provider and dietitian to develop a plan to achieve or maintain a healthy weight. Losing weight can help reduce uric acid in your blood. What foods are recommended? The items listed may not be a complete list. Talk with your dietitian aboutwhat dietary choices are best for you. Foods low in purines Foods low in purines do not need to be limited. These include: All fruits. All low-purine vegetables, pickles, and olives. Breads, pasta, rice, cornbread, and popcorn. Cake and other baked goods. All dairy foods. Eggs, nuts, and nut butters. Spices and condiments, such as salt, herbs, and vinegar. Plant oils, butter, and margarine. Water, sugar-free soft drinks, tea, coffee, and cocoa. Vegetable-based soups, broths, sauces, and gravies. Foods moderate in purines Foods moderate in purines should be limited to the amounts listed.  cup of asparagus, cauliflower, spinach, mushrooms, or green peas, each day. 2/3 cup uncooked oatmeal, each day.  cup dry wheat bran or wheat germ, each day. 2-3 ounces of meat or poultry, each day. 4-6 ounces of shellfish, such as crab, lobster, oysters, or shrimp, each day. 1 cup cooked beans, peas, or lentils, each day. Soup, broths, or bouillon made from meat or fish. Limit these foods as much as possible. What foods are not recommended? The items listed may not be a complete list. Talk with your dietitian aboutwhat dietary choices are best for you.   Limit your intake of foods high in purines, including: Beer and other alcohol. Meat-based gravy or sauce. Canned or fresh fish, such as: Anchovies, sardines, herring, and tuna. Mussels and scallops. Codfish, trout, and haddock. Bacon. Organ meats, such as: Liver or kidney. Tripe. Sweetbreads (thymus gland or pancreas). Wild game or goose. Yeast or yeast extract supplements. Drinks sweetened with  high-fructose corn syrup. Summary Eating a low-purine diet can help control conditions caused by too much uric acid in the body, such as gout or kidney stones. Choose low-purine foods, limit alcohol, and limit foods high in fat. You will learn over time which foods do or do not affect you. If you find out that a food tends to cause your gout symptoms to flare up, avoid eating that food. This information is not intended to replace advice given to you by your health care provider. Make sure you discuss any questions you have with your healthcare provider. Document Revised: 08/24/2019 Document Reviewed: 08/24/2019 Elsevier Patient Education  2022 Elsevier Inc.  

## 2021-07-21 NOTE — Progress Notes (Signed)
Office Note 07/21/2021  CC:  Chief Complaint  Patient presents with   Annual Exam    Pt is fasting    HPI:  Patient is a 51 y.o. male who is here for annual health maintenance exam. He has hx of gout and mild mixed hyperlipidemia.  He is feeling well. 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  He did not note any acute gout flares occurring during that time.  He then restarted the allopurinol at week ago and does note that his joints feel better overall.  Past Medical History:  Diagnosis Date   Gout 2019/2020   History of adenomatous polyp of colon    Hypertriglyceridemia 2014   pt denies on 04/30/20    Past Surgical History:  Procedure Laterality Date   COLONOSCOPY  04/2020   adenoma x 1, recall 04/2025    Family History  Problem Relation Age of Onset   Diabetes Mother    Heart disease Mother    Stroke Mother    Diabetes Father    Stroke Maternal Grandfather    Cancer Neg Hx    Colon cancer Neg Hx    Colon polyps Neg Hx    Esophageal cancer Neg Hx    Rectal cancer Neg Hx    Stomach cancer Neg Hx     Social History   Socioeconomic History   Marital status: Married    Spouse name: Not on file   Number of children: Not on file   Years of education: Not on file   Highest education level: Not on file  Occupational History   Not on file  Tobacco Use   Smoking status: Never   Smokeless tobacco: Never  Vaping Use   Vaping Use: Never used  Substance and Sexual Activity   Alcohol use: Not Currently    Comment: rare    Drug use: No   Sexual activity: Not on file  Other Topics Concern   Not on file  Social History Narrative   Married, male partner.  Three children.   Educ: PhD: [employer].  Relocated from [facility] 2015.   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  Occasional glass  of wine.   Social Determinants of Health   Financial Resource Strain: Not on file  Food Insecurity: Not on file  Transportation Needs: Not on file  Physical Activity: Not on file  Stress: Not on file  Social Connections: Not on file  Intimate Partner Violence: Not on file    Outpatient Medications Prior to Visit  Medication Sig Dispense Refill   allopurinol (ZYLOPRIM) 100 MG tablet TAKE 1 TABLET(100 MG) BY MOUTH DAILY 30 tablet 0   Multiple Vitamins-Minerals (MULTIVITAMIN MEN) TABS Take by mouth daily.     diltiazem 2 % GEL Apply qid prn anal pain (Patient not taking: Reported on 08/30/2020) 30 g 3   HYDROcodone-acetaminophen (NORCO/VICODIN) 5-325 MG tablet Take 1 tablet by mouth every 6 (six) hours as needed for moderate pain. (Patient not taking: Reported on 08/30/2020) 20 tablet 0   hydrocortisone-pramoxine (ANALPRAM-HC) 2.5-1 % rectal cream  (Patient not taking: Reported on 08/30/2020)     ibuprofen (ADVIL) 100 MG tablet Take 100 mg by mouth as needed for pain. (Patient not taking: No sig reported)     No facility-administered medications prior to visit.    Allergies  Allergen Reactions  Cheese Nausea Only    Blue cheese   ROS Review of Systems  Constitutional:  Negative for appetite change, chills, fatigue and fever.  HENT:  Negative for congestion, dental problem, ear pain and sore throat.   Eyes:  Negative for discharge, redness and visual disturbance.  Respiratory:  Negative for cough, chest tightness, shortness of breath and wheezing.   Cardiovascular:  Negative for chest pain, palpitations and leg swelling.  Gastrointestinal:  Negative for abdominal pain, blood in stool, diarrhea, nausea and vomiting.  Genitourinary:  Negative for difficulty urinating, dysuria, flank pain, frequency, hematuria and urgency.  Musculoskeletal:  Negative for arthralgias, back pain, joint swelling, myalgias and neck stiffness.  Skin:  Positive for rash (as per hpi). Negative for pallor.   Neurological:  Negative for dizziness, speech difficulty, weakness and headaches.  Hematological:  Negative for adenopathy. Does not bruise/bleed easily.  Psychiatric/Behavioral:  Negative for confusion and sleep disturbance. The patient is not nervous/anxious.    PE; Vitals with BMI 07/21/2021 08/30/2020 08/12/2020  Height 6\' 1"  6' 0.84" 6' 0.835"  Weight 237 lbs 3 oz 235 lbs 233 lbs 3 oz  BMI 31.3 58.09 98.33  Systolic 825 053 976  Diastolic 76 72 90  Pulse 60 62 58   Gen: Alert, well appearing.  Patient is oriented to person, place, time, and situation. AFFECT: pleasant, lucid thought and speech. ENT: Ears: EACs clear, normal epithelium.  TMs with good light reflex and landmarks bilaterally.  Eyes: no injection, icteris, swelling, or exudate.  EOMI, PERRLA. Nose: no drainage or turbinate edema/swelling.  No injection or focal lesion.  Mouth: lips without lesion/swelling.  Oral mucosa pink and moist.  Dentition intact and without obvious caries or gingival swelling.  Oropharynx without erythema, exudate, or swelling.  Neck: supple/nontender.  No LAD, mass, or TM.  Carotid pulses 2+ bilaterally, without bruits. CV: RRR, no m/r/g.   LUNGS: CTA bilat, nonlabored resps, good aeration in all lung fields. ABD: soft, NT, ND, BS normal.  No hepatospenomegaly or mass.  No bruits. EXT: no clubbing, cyanosis, or edema.  Musculoskeletal: no joint swelling, erythema, warmth, or tenderness.  ROM of all joints intact. Skin - Both shins with a large hyperkeratotic non-inflamed plaque.  Otherwise no sores or suspicious lesions or rashes or color changes  Pertinent labs:  Lab Results  Component Value Date   TSH 2.29 01/26/2020   Lab Results  Component Value Date   WBC 6.9 01/26/2020   HGB 13.6 01/26/2020   HCT 42.6 01/26/2020   MCV 82.5 01/26/2020   PLT 198.0 01/26/2020   Lab Results  Component Value Date   CREATININE 1.11 01/26/2020   BUN 15 01/26/2020   NA 138 01/26/2020   K 4.3 01/26/2020    CL 103 01/26/2020   CO2 28 01/26/2020   Lab Results  Component Value Date   ALT 24 01/26/2020   AST 24 01/26/2020   ALKPHOS 55 01/26/2020   BILITOT 0.7 01/26/2020   Lab Results  Component Value Date   CHOL 186 08/23/2020   Lab Results  Component Value Date   HDL 35.20 (L) 08/23/2020   Lab Results  Component Value Date   LDLCALC 116 (H) 08/23/2020   Lab Results  Component Value Date   TRIG 173.0 (H) 08/23/2020   Lab Results  Component Value Date   CHOLHDL 5 08/23/2020   Lab Results  Component Value Date   LABURIC 6.6 01/26/2020   ASSESSMENT AND PLAN:   #1 gout: Has done well  on maintenance allopurinol 100 mg a day We will stay on this and check uric acid level today.    2) Health maintenance exam: Reviewed age and gender appropriate health maintenance issues (prudent diet, regular exercise, health risks of tobacco and excessive alcohol, use of seatbelts, fire alarms in home, use of sunscreen).  Also reviewed age and gender appropriate health screening as well as vaccine recommendations. Vaccines: Flu->UTD.  Otherwise UTD. Labs: ordered HP labs, uric acid, and PSA today. Prostate ca screening: PSA today. Colon ca screening: History of adenomatous polyp.  Recall 2026.  #3 skin rash on both shins.  We have tried treating this with over-the-counter topical steroids in the past and this has not done anything.  He is asymptomatic. Refer to dermatology today.  An After Visit Summary was printed and given to the patient.  FOLLOW UP:  Return in about 1 year (around 07/21/2022) for annual CPE (fasting.  Signed:  Crissie Sickles, MD           07/21/2021

## 2021-07-22 LAB — HEPATITIS C ANTIBODY
Hepatitis C Ab: NONREACTIVE
SIGNAL TO CUT-OFF: 0.04 (ref <1.00)

## 2021-07-22 LAB — PSA: PSA: 0.46 ng/mL (ref 0.10–4.00)

## 2021-07-22 LAB — TSH: TSH: 2.03 u[IU]/mL (ref 0.35–5.50)

## 2021-09-10 ENCOUNTER — Ambulatory Visit (INDEPENDENT_AMBULATORY_CARE_PROVIDER_SITE_OTHER): Payer: BC Managed Care – PPO | Admitting: Family Medicine

## 2021-09-10 ENCOUNTER — Encounter: Payer: Self-pay | Admitting: Family Medicine

## 2021-09-10 VITALS — BP 118/71 | HR 65 | Temp 97.9°F | Ht 73.0 in | Wt 234.0 lb

## 2021-09-10 DIAGNOSIS — R051 Acute cough: Secondary | ICD-10-CM | POA: Diagnosis not present

## 2021-09-10 DIAGNOSIS — B9689 Other specified bacterial agents as the cause of diseases classified elsewhere: Secondary | ICD-10-CM

## 2021-09-10 DIAGNOSIS — J329 Chronic sinusitis, unspecified: Secondary | ICD-10-CM | POA: Diagnosis not present

## 2021-09-10 LAB — POC COVID19 BINAXNOW: SARS Coronavirus 2 Ag: NEGATIVE

## 2021-09-10 MED ORDER — DOXYCYCLINE HYCLATE 100 MG PO TABS: 100.0000 mg | ORAL_TABLET | Freq: Two times a day (BID) | ORAL | 0 refills | Status: DC

## 2021-09-10 MED ORDER — METHYLPREDNISOLONE ACETATE 80 MG/ML IJ SUSP
80.0000 mg | Freq: Once | INTRAMUSCULAR | Status: AC
  Administered 2021-09-10: 80 mg via INTRAMUSCULAR

## 2021-09-10 NOTE — Patient Instructions (Signed)

## 2021-09-10 NOTE — Progress Notes (Signed)
? ? ? ?This visit occurred during the SARS-CoV-2 public health emergency.  Safety protocols were in place, including screening questions prior to the visit, additional usage of staff PPE, and extensive cleaning of exam room while observing appropriate contact time as indicated for disinfecting solutions.  ? ? ?Austin Sanchez , 24-Nov-1970, 51 y.o., male ?MRN: [id number] ?Patient Care Team  ?  Relationship Specialty Notifications Start End  ?Tammi Sou, MD PCP - General Family Medicine  12/03/15   ? ? ?Chief Complaint  ?Patient presents with  ? Cough  ?  Pt c/o sneezing, nasal drainage, chest congestion, dry cough, SOB, and wheezing x 4 days;   ? ?  ?Subjective: Pt presents for an OV with complaints of sneezing, nasal drainage, cough, wheezing, sinus pressure, chest tightness of 4 days duration.  Associated symptoms pt reports his family has been ill with strep. He never has appreciated a sore throat, but started amox 500 BID 2.5 days ago. He took zyrtec once. He is tolerating PO. ? ? ?  09/10/2021  ?  3:12 PM 08/30/2020  ?  3:36 PM 01/26/2020  ? 11:08 AM 01/02/2019  ?  2:05 PM 08/17/2017  ?  8:23 AM  ?Depression screen PHQ 2/9  ?Decreased Interest 0 0 0 0 0  ?Down, Depressed, Hopeless 0 0 0 0 0  ?PHQ - 2 Score 0 0 0 0 0  ? ? ?Allergies  ?Allergen Reactions  ? Cheese Nausea Only  ?  Blue cheese  ? ?Social History  ? ?Social History Narrative  ? Married, male partner.  Three children.  ? Educ: PhD: [employer].  Relocated from [facility] 2015.  ? 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  ? No tob.  Occasional glass of wine.  ? ?Past Medical History:  ?Diagnosis Date  ? Gout 2019/2020  ? History of adenomatous polyp of colon   ? Hypertriglyceridemia 2014  ? pt denies on 04/30/20  ? ?Past Surgical History:  ?Procedure Laterality Date  ? COLONOSCOPY  04/2020  ? adenoma x 1, recall 04/2025  ? ?Family History  ?Problem Relation Age of Onset  ? Diabetes Mother   ? Heart disease Mother   ? Stroke  Mother   ? Diabetes Father   ? Stroke Maternal Grandfather   ? Cancer Neg Hx   ? Colon cancer Neg Hx   ? Colon polyps Neg Hx   ? Esophageal cancer Neg Hx   ? Rectal cancer Neg Hx   ? Stomach cancer Neg Hx   ? ?Allergies as of 09/10/2021   ? ?   Reactions  ? Cheese Nausea Only  ? Blue cheese  ? ?  ? ?  ?Medication List  ?  ? ?  ? Accurate as of September 10, 2021  3:40 PM. If you have any questions, ask your nurse or doctor.  ?  ?  ? ?  ? ?allopurinol 100 MG tablet ?Commonly known as: ZYLOPRIM ?TAKE 1 TABLET(100 MG) BY MOUTH DAILY ?  ?doxycycline 100 MG tablet ?Commonly known as: VIBRA-TABS ?Take 1 tablet (100 mg total) by mouth 2 (two) times daily. ?Started by: Howard Pouch, DO ?  ?Multivitamin Men Tabs ?Take by mouth daily. ?  ? ?  ? ? ?All past medical history, surgical history, allergies, family history, immunizations andmedications were updated in the EMR today and reviewed under the history and medication portions of their EMR.    ? ?ROS ?Negative, with the exception of above mentioned in HPI ? ? ?  Objective:  ?BP 118/71   Pulse 65   Temp 97.9 ?F (36.6 ?C) (Oral)   Ht '6\' 1"'$  (1.854 m)   Wt 234 lb (106.1 kg)   SpO2 99%   BMI 30.87 kg/m?  ?Body mass index is 30.87 kg/m?Marland Kitchen ?Physical Exam ?Vitals and nursing note reviewed.  ?Constitutional:   ?   General: He is not in acute distress. ?   Appearance: Normal appearance. He is not ill-appearing, toxic-appearing or diaphoretic.  ?HENT:  ?   Head: Normocephalic and atraumatic.  ?   Right Ear: Tympanic membrane and ear canal normal.  ?   Left Ear: Tympanic membrane and ear canal normal.  ?   Nose: Mucosal edema, congestion and rhinorrhea present. Rhinorrhea is purulent.  ?   Right Turbinates: Enlarged.  ?   Left Turbinates: Enlarged.  ?   Right Sinus: Maxillary sinus tenderness present.  ?   Left Sinus: Maxillary sinus tenderness present.  ?   Mouth/Throat:  ?   Pharynx: No oropharyngeal exudate or posterior oropharyngeal erythema.  ?Eyes:  ?   General: No scleral icterus.     ?   Right eye: No discharge.     ?   Left eye: No discharge.  ?   Extraocular Movements: Extraocular movements intact.  ?   Pupils: Pupils are equal, round, and reactive to light.  ?Cardiovascular:  ?   Rate and Rhythm: Normal rate and regular rhythm.  ?Pulmonary:  ?   Effort: Pulmonary effort is normal. No respiratory distress.  ?   Breath sounds: Rhonchi present. No wheezing or rales.  ?Musculoskeletal:  ?   Cervical back: Neck supple.  ?Lymphadenopathy:  ?   Cervical: No cervical adenopathy.  ?Skin: ?   General: Skin is warm and dry.  ?   Coloration: Skin is not pale.  ?   Findings: No rash.  ?Neurological:  ?   Mental Status: He is alert and oriented to person, place, and time. Mental status is at baseline.  ?Psychiatric:     ?   Mood and Affect: Mood normal.     ?   Behavior: Behavior normal.     ?   Thought Content: Thought content normal.     ?   Judgment: Judgment normal.  ? ? ? ?No results found. ?No results found. ?No results found for this or any previous visit (from the past 24 hour(s)). ? ?Assessment/Plan: ?Austin Sanchez is a 51 y.o. male present for OV for  ?Acute cough/ Bacterial sinusitis ?Rest, hydrate.  ?Start OTC  flonase, zyrtec (nightly) mucinex (DM if cough), and nasal saline.  ?Doxy BID prescribed, take until completed.  ?Offered tessalon perles or prescribed cough med, he declined.  ?If cough present it can last up to 6 weeks.  ?F/U 2 weeks if not improved.  ?- POC COVID-19 BinaxNow> negative ?- methylPREDNISolone acetate (DEPO-MEDROL) injection 80 mg ? ?Return in about 2 weeks (around 09/24/2021), or if symptoms worsen or fail to improve. ? ?Reviewed expectations re: course of current medical issues. ?Discussed self-management of symptoms. ?Outlined signs and symptoms indicating need for more acute intervention. ?Patient verbalized understanding and all questions were answered. ?Patient received an After-Visit Summary. ? ? ? ?Orders Placed This Encounter  ?Procedures  ? POC COVID-19  BinaxNow  ? ?Meds ordered this encounter  ?Medications  ? doxycycline (VIBRA-TABS) 100 MG tablet  ?  Sig: Take 1 tablet (100 mg total) by mouth 2 (two) times daily.  ?  Dispense:  20 tablet  ?  Refill:  0  ? methylPREDNISolone acetate (DEPO-MEDROL) injection 80 mg  ? ?Referral Orders  ?No referral(s) requested today  ? ? ? ?Note is dictated utilizing voice recognition software. Although note has been proof read prior to signing, occasional typographical errors still can be missed. If any questions arise, please do not hesitate to call for verification.  ? ?electronically signed by: ? ?Howard Pouch, DO  ?[facility] [place] ? ? ? ?

## 2022-03-20 ENCOUNTER — Ambulatory Visit: Payer: BC Managed Care – PPO

## 2022-03-30 DIAGNOSIS — Z23 Encounter for immunization: Secondary | ICD-10-CM | POA: Diagnosis not present

## 2022-04-01 DIAGNOSIS — Z23 Encounter for immunization: Secondary | ICD-10-CM | POA: Diagnosis not present

## 2022-05-07 ENCOUNTER — Telehealth: Payer: Self-pay | Admitting: Family Medicine

## 2022-05-07 MED ORDER — ALLOPURINOL 100 MG PO TABS: ORAL_TABLET | ORAL | 0 refills | Status: DC

## 2022-05-07 NOTE — Telephone Encounter (Signed)
Pt called is seeking advice on rather he needs an appointment or not to restart Allopurinol. I informed him that he will need an appointment to discuss. He declined to schedule and asked if I could just send a message back asking if he could get a new prescription for the Allopurinol.

## 2022-05-07 NOTE — Telephone Encounter (Signed)
90-day supply sent in today. Needs follow-up in 3 months for his physical.

## 2022-05-07 NOTE — Telephone Encounter (Signed)
Patient was last seen 07/21/21, noted in visit notes: "#1 gout: Has done well on maintenance allopurinol 100 mg a day. We will stay on this and check uric acid level today."  Please advise.

## 2022-05-08 NOTE — Telephone Encounter (Signed)
Spoke with patient regarding results/recommendations.  

## 2022-06-30 ENCOUNTER — Other Ambulatory Visit: Payer: Self-pay

## 2022-06-30 MED ORDER — ALLOPURINOL 100 MG PO TABS: ORAL_TABLET | ORAL | 0 refills | Status: DC

## 2022-07-22 NOTE — Patient Instructions (Signed)
Health Maintenance, Male Adopting a healthy lifestyle and getting preventive care are important in promoting health and wellness. Ask your health care provider about: The right schedule for you to have regular tests and exams. Things you can do on your own to prevent diseases and keep yourself healthy. What should I know about diet, weight, and exercise? Eat a healthy diet  Eat a diet that includes plenty of vegetables, fruits, low-fat dairy products, and lean protein. Do not eat a lot of foods that are high in solid fats, added sugars, or sodium. Maintain a healthy weight Body mass index (BMI) is a measurement that can be used to identify possible weight problems. It estimates body fat based on height and weight. Your health care provider can help determine your BMI and help you achieve or maintain a healthy weight. Get regular exercise Get regular exercise. This is one of the most important things you can do for your health. Most adults should: Exercise for at least 150 minutes each week. The exercise should increase your heart rate and make you sweat (moderate-intensity exercise). Do strengthening exercises at least twice a week. This is in addition to the moderate-intensity exercise. Spend less time sitting. Even light physical activity can be beneficial. Watch cholesterol and blood lipids Have your blood tested for lipids and cholesterol at 51 years of age, then have this test every 5 years. You may need to have your cholesterol levels checked more often if: Your lipid or cholesterol levels are high. You are older than 51 years of age. You are at high risk for heart disease. What should I know about cancer screening? Many types of cancers can be detected early and may often be prevented. Depending on your health history and family history, you may need to have cancer screening at various ages. This may include screening for: Colorectal cancer. Prostate cancer. Skin cancer. Lung  cancer. What should I know about heart disease, diabetes, and high blood pressure? Blood pressure and heart disease High blood pressure causes heart disease and increases the risk of stroke. This is more likely to develop in people who have high blood pressure readings or are overweight. Talk with your health care provider about your target blood pressure readings. Have your blood pressure checked: Every 3-5 years if you are 18-39 years of age. Every year if you are 40 years old or older. If you are between the ages of 65 and 75 and are a current or former smoker, ask your health care provider if you should have a one-time screening for abdominal aortic aneurysm (AAA). Diabetes Have regular diabetes screenings. This checks your fasting blood sugar level. Have the screening done: Once every three years after age 45 if you are at a normal weight and have a low risk for diabetes. More often and at a younger age if you are overweight or have a high risk for diabetes. What should I know about preventing infection? Hepatitis B If you have a higher risk for hepatitis B, you should be screened for this virus. Talk with your health care provider to find out if you are at risk for hepatitis B infection. Hepatitis C Blood testing is recommended for: Everyone born from 1945 through 1965. Anyone with known risk factors for hepatitis C. Sexually transmitted infections (STIs) You should be screened each year for STIs, including gonorrhea and chlamydia, if: You are sexually active and are younger than 51 years of age. You are older than 51 years of age and your   health care provider tells you that you are at risk for this type of infection. Your sexual activity has changed since you were last screened, and you are at increased risk for chlamydia or gonorrhea. Ask your health care provider if you are at risk. Ask your health care provider about whether you are at high risk for HIV. Your health care provider  may recommend a prescription medicine to help prevent HIV infection. If you choose to take medicine to prevent HIV, you should first get tested for HIV. You should then be tested every 3 months for as long as you are taking the medicine. Follow these instructions at home: Alcohol use Do not drink alcohol if your health care provider tells you not to drink. If you drink alcohol: Limit how much you have to 0-2 drinks a day. Know how much alcohol is in your drink. In the [place] one drink equals one 12 oz bottle of beer (355 mL), one 5 oz glass of wine (148 mL), or one 1 oz glass of hard liquor (44 mL). Lifestyle Do not use any products that contain nicotine or tobacco. These products include cigarettes, chewing tobacco, and vaping devices, such as e-cigarettes. If you need help quitting, ask your health care provider. Do not use street drugs. Do not share needles. Ask your health care provider for help if you need support or information about quitting drugs. General instructions Schedule regular health, dental, and eye exams. Stay current with your vaccines. Tell your health care provider if: You often feel depressed. You have ever been abused or do not feel safe at home. Summary Adopting a healthy lifestyle and getting preventive care are important in promoting health and wellness. Follow your health care provider's instructions about healthy diet, exercising, and getting tested or screened for diseases. Follow your health care provider's instructions on monitoring your cholesterol and blood pressure. This information is not intended to replace advice given to you by your health care provider. Make sure you discuss any questions you have with your health care provider. Document Revised: 09/30/2020 Document Reviewed: 09/30/2020 Elsevier Patient Education  2023 Elsevier Inc.  

## 2022-07-23 ENCOUNTER — Ambulatory Visit (INDEPENDENT_AMBULATORY_CARE_PROVIDER_SITE_OTHER): Payer: BC Managed Care – PPO | Admitting: Family Medicine

## 2022-07-23 ENCOUNTER — Encounter: Payer: Self-pay | Admitting: Family Medicine

## 2022-07-23 VITALS — BP 109/73 | HR 62 | Temp 97.9°F | Ht 73.0 in | Wt 235.4 lb

## 2022-07-23 DIAGNOSIS — Z125 Encounter for screening for malignant neoplasm of prostate: Secondary | ICD-10-CM | POA: Diagnosis not present

## 2022-07-23 DIAGNOSIS — Z23 Encounter for immunization: Secondary | ICD-10-CM | POA: Diagnosis not present

## 2022-07-23 DIAGNOSIS — M1000 Idiopathic gout, unspecified site: Secondary | ICD-10-CM | POA: Diagnosis not present

## 2022-07-23 DIAGNOSIS — Z0000 Encounter for general adult medical examination without abnormal findings: Secondary | ICD-10-CM | POA: Diagnosis not present

## 2022-07-23 LAB — COMPREHENSIVE METABOLIC PANEL
ALT: 26 U/L (ref 0–53)
AST: 24 U/L (ref 0–37)
Albumin: 4.1 g/dL (ref 3.5–5.2)
Alkaline Phosphatase: 58 U/L (ref 39–117)
BUN: 17 mg/dL (ref 6–23)
CO2: 28 mEq/L (ref 19–32)
Calcium: 9.5 mg/dL (ref 8.4–10.5)
Chloride: 102 mEq/L (ref 96–112)
Creatinine, Ser: 1.1 mg/dL (ref 0.40–1.50)
GFR: 78.28 mL/min (ref >60.00)
Glucose, Bld: 106 mg/dL — ABNORMAL HIGH (ref 70–99)
Potassium: 4.2 mEq/L (ref 3.5–5.1)
Sodium: 138 mEq/L (ref 135–145)
Total Bilirubin: 0.7 mg/dL (ref 0.2–1.2)
Total Protein: 7.2 g/dL (ref 6.0–8.3)

## 2022-07-23 LAB — URIC ACID: Uric Acid, Serum: 7.1 mg/dL (ref 4.0–7.8)

## 2022-07-23 LAB — LIPID PANEL
Cholesterol: 193 mg/dL (ref 0–200)
HDL: 36.7 mg/dL — ABNORMAL LOW (ref >39.00)
LDL Cholesterol: 124 mg/dL — ABNORMAL HIGH (ref 0–99)
NonHDL: 156.78
Total CHOL/HDL Ratio: 5
Triglycerides: 165 mg/dL — ABNORMAL HIGH (ref 0.0–149.0)
VLDL: 33 mg/dL (ref 0.0–40.0)

## 2022-07-23 LAB — CBC
HCT: 43.4 % (ref 39.0–52.0)
Hemoglobin: 14.3 g/dL (ref 13.0–17.0)
MCHC: 32.9 g/dL (ref 30.0–36.0)
MCV: 80.4 fl (ref 78.0–100.0)
Platelets: 213 10*3/uL (ref 150.0–400.0)
RBC: 5.4 Mil/uL (ref 4.22–5.81)
RDW: 14 % (ref 11.5–15.5)
WBC: 6.2 10*3/uL (ref 4.0–10.5)

## 2022-07-23 LAB — PSA: PSA: 0.61 ng/mL (ref 0.10–4.00)

## 2022-07-23 LAB — TSH: TSH: 2.09 u[IU]/mL (ref 0.35–5.50)

## 2022-07-23 MED ORDER — ALLOPURINOL 100 MG PO TABS: ORAL_TABLET | ORAL | 1 refills | Status: DC

## 2022-07-23 NOTE — Progress Notes (Signed)
Office Note 07/23/2022  CC:  Chief Complaint  Patient presents with   Annual Exam    HPI:  Patient is a 52 y.o. male who is here for annual health maintenance exam. He has a history of gout and mild mixed hyperlipidemia.  Feeling well. Walking regularly for exercise.  Eats a good diet. No problems with arthritis.  Past Medical History:  Diagnosis Date   Gout 2019/2020   History of adenomatous polyp of colon    Hypertriglyceridemia 2014   pt denies on 04/30/20    Past Surgical History:  Procedure Laterality Date   COLONOSCOPY  04/2020   adenoma x 1, recall 04/2025    Family History  Problem Relation Age of Onset   Diabetes Mother    Heart disease Mother    Stroke Mother    Diabetes Father    Stroke Maternal Grandfather    Cancer Neg Hx    Colon cancer Neg Hx    Colon polyps Neg Hx    Esophageal cancer Neg Hx    Rectal cancer Neg Hx    Stomach cancer Neg Hx     Social History   Socioeconomic History   Marital status: Married    Spouse name: Not on file   Number of children: Not on file   Years of education: Not on file   Highest education level: Not on file  Occupational History   Not on file  Tobacco Use   Smoking status: Never   Smokeless tobacco: Never  Vaping Use   Vaping Use: Never used  Substance and Sexual Activity   Alcohol use: Not Currently    Comment: rare    Drug use: No   Sexual activity: Not on file  Other Topics Concern   Not on file  Social History Narrative   Married, male partner.  Three children.   Educ: PhD: [employer].  Relocated from [facility] 2015.   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  Occasional glass of wine.   Social Determinants of Health   Financial Resource Strain: Not on file  Food Insecurity: Not on file  Transportation Needs: Not on file  Physical Activity: Not on file  Stress: Not on file  Social Connections: Not on file  Intimate Partner Violence: Not  on file    Outpatient Medications Prior to Visit  Medication Sig Dispense Refill   allopurinol (ZYLOPRIM) 100 MG tablet TAKE 1 TABLET(100 MG) BY MOUTH DAILY 30 tablet 0   Multiple Vitamins-Minerals (MULTIVITAMIN MEN) TABS Take by mouth daily.     doxycycline (VIBRA-TABS) 100 MG tablet Take 1 tablet (100 mg total) by mouth 2 (two) times daily. 20 tablet 0   No facility-administered medications prior to visit.    Allergies  Allergen Reactions   Cheese Nausea Only    Blue cheese    Review of Systems  Constitutional:  Negative for appetite change, chills, fatigue and fever.  HENT:  Negative for congestion, dental problem, ear pain and sore throat.   Eyes:  Negative for discharge, redness and visual disturbance.  Respiratory:  Negative for cough, chest tightness, shortness of breath and wheezing.   Cardiovascular:  Negative for chest pain, palpitations and leg swelling.  Gastrointestinal:  Negative for abdominal pain, blood in stool, diarrhea, nausea and vomiting.  Genitourinary:  Negative for difficulty urinating, dysuria, flank pain, frequency, hematuria and urgency.  Musculoskeletal:  Negative for arthralgias, back pain, joint swelling, myalgias and neck stiffness.  Small bump on inner aspect of left heel  Skin:  Negative for pallor and rash.  Neurological:  Negative for dizziness, speech difficulty, weakness and headaches.  Hematological:  Negative for adenopathy. Does not bruise/bleed easily.  Psychiatric/Behavioral:  Negative for confusion and sleep disturbance. The patient is not nervous/anxious.     PE;    07/23/2022    8:57 AM 09/10/2021    3:12 PM 07/21/2021    1:28 PM  Vitals with BMI  Height '6\' 1"'$  '6\' 1"'$  '6\' 1"'$   Weight 235 lbs 6 oz 234 lbs 237 lbs 3 oz  BMI 31.06 [id number]  Systolic 0000000 [id number]  Diastolic 73 71 76  Pulse 62 65 60   Gen: Alert, well appearing.  Patient is oriented to person, place, time, and situation. AFFECT: pleasant, lucid thought and  speech. ENT: Ears: EACs clear, normal epithelium.  TMs with good light reflex and landmarks bilaterally.  Eyes: no injection, icteris, swelling, or exudate.  EOMI, PERRLA. Nose: no drainage or turbinate edema/swelling.  No injection or focal lesion.  Mouth: lips without lesion/swelling.  Oral mucosa pink and moist.  Dentition intact and without obvious caries or gingival swelling.  Oropharynx without erythema, exudate, or swelling.  Neck: supple/nontender.  No LAD, mass, or TM.  Carotid pulses 2+ bilaterally, without bruits. CV: RRR, no m/r/g.   LUNGS: CTA bilat, nonlabored resps, good aeration in all lung fields. ABD: soft, NT, ND, BS normal.  No hepatospenomegaly or mass.  No bruits. EXT: no clubbing, cyanosis, or edema.  Musculoskeletal: no joint swelling, erythema, warmth, or tenderness.  ROM of all joints intact. Medial aspect of left foot calcaneus with a marble sized firm nodule attached to the bone.  Minimal discomfort to firm palpation. Skin - no sores or suspicious lesions or rashes or color changes  Pertinent labs:  Lab Results  Component Value Date   TSH 2.03 07/21/2021   Lab Results  Component Value Date   WBC 7.3 07/21/2021   HGB 13.4 07/21/2021   HCT 41.9 07/21/2021   MCV 80.8 07/21/2021   PLT 210.0 07/21/2021   Lab Results  Component Value Date   CREATININE 1.10 07/21/2021   BUN 15 07/21/2021   NA 136 07/21/2021   K 4.1 07/21/2021   CL 100 07/21/2021   CO2 27 07/21/2021   Lab Results  Component Value Date   ALT 32 07/21/2021   AST 27 07/21/2021   ALKPHOS 53 07/21/2021   BILITOT 0.7 07/21/2021   Lab Results  Component Value Date   CHOL 206 (H) 07/21/2021   Lab Results  Component Value Date   HDL 39.50 07/21/2021   Lab Results  Component Value Date   LDLCALC 131 (H) 07/21/2021   Lab Results  Component Value Date   TRIG 179.0 (H) 07/21/2021   Lab Results  Component Value Date   CHOLHDL 5 07/21/2021   Lab Results  Component Value Date   PSA  0.46 07/21/2021   ASSESSMENT AND PLAN:   #1 health maintenance exam: Reviewed age and gender appropriate health maintenance issues (prudent diet, regular exercise, health risks of tobacco and excessive alcohol, use of seatbelts, fire alarms in home, use of sunscreen).  Also reviewed age and gender appropriate health screening as well as vaccine recommendations. Vaccines: Shingrix-->#1 today.  Otherwise UTD. Labs: ordered HP labs, uric acid, and PSA today. Prostate ca screening: PSA today. Colon ca screening: History of adenomatous polyp.  Recall 2026.  #2 gout, doing well on 100 mg  allopurinol daily. Uric acid level today.  3.  Exostosis of left calcaneus.  Reassured.  Monitor.  An After Visit Summary was printed and given to the patient.  FOLLOW UP:  Return in about 1 year (around 07/23/2023) for annual CPE (fasting).  Signed:  Crissie Sickles, MD           07/23/2022

## 2022-07-24 ENCOUNTER — Encounter: Payer: Self-pay | Admitting: Family Medicine

## 2022-10-28 ENCOUNTER — Ambulatory Visit (INDEPENDENT_AMBULATORY_CARE_PROVIDER_SITE_OTHER): Payer: BC Managed Care – PPO | Admitting: Family Medicine

## 2022-10-28 ENCOUNTER — Encounter: Payer: Self-pay | Admitting: Family Medicine

## 2022-10-28 VITALS — BP 129/85 | HR 50 | Temp 97.7°F | Ht 73.0 in | Wt 232.8 lb

## 2022-10-28 DIAGNOSIS — L853 Xerosis cutis: Secondary | ICD-10-CM

## 2022-10-28 DIAGNOSIS — R21 Rash and other nonspecific skin eruption: Secondary | ICD-10-CM | POA: Diagnosis not present

## 2022-10-28 DIAGNOSIS — B029 Zoster without complications: Secondary | ICD-10-CM | POA: Diagnosis not present

## 2022-10-28 MED ORDER — ALLOPURINOL 100 MG PO TABS: ORAL_TABLET | ORAL | 1 refills | Status: DC

## 2022-10-28 NOTE — Progress Notes (Signed)
OFFICE VISIT  10/28/2022  CC:  Chief Complaint  Patient presents with   Rash    Located on left side of abdomen, 1 spot;  started 1 week ago     Patient is a 52 y.o. male who presents for rash.  HPI: He got a small painful splotch of vesicles on the left lower abdomen about a week ago.  They dried up/redness went away and now has a couple of dark spots in the area.  No longer hurts.  He did not get any other skin lesions.  Around that time he also had some nasal congestion and postnasal drip and some sneezing. He currently feels well.  He also relates having a vasovagal episode a couple of days ago, seemingly triggered/unprovoked. Lasted about a minute.  Has chronic patches of dry skin on the lower legs. Requests dermatology referral.   Past Medical History:  Diagnosis Date   Gout 2019/2020   History of adenomatous polyp of colon    Hypertriglyceridemia 2014    Past Surgical History:  Procedure Laterality Date   COLONOSCOPY  04/2020   adenoma x 1, recall 04/2025    Outpatient Medications Prior to Visit  Medication Sig Dispense Refill   allopurinol (ZYLOPRIM) 100 MG tablet TAKE 1 TABLET(100 MG) BY MOUTH DAILY 90 tablet 1   Multiple Vitamins-Minerals (MULTIVITAMIN MEN) TABS Take by mouth daily.     No facility-administered medications prior to visit.    Allergies  Allergen Reactions   Cheese Nausea Only    Blue cheese    Review of Systems  As per HPI  PE:    10/28/2022    8:22 AM 07/23/2022    8:57 AM 09/10/2021    3:12 PM  Vitals with BMI  Height 6\' 1"  6\' 1"  6\' 1"   Weight 232 lbs 13 oz 235 lbs 6 oz 234 lbs  BMI 30.72 31.06 30.88  Systolic 129 109 956  Diastolic 85 73 71  Pulse 50 62 65     Physical Exam  Gen: Alert, well appearing.  Patient is oriented to person, place, time, and situation. AFFECT: pleasant, lucid thought and speech. left lower abdomen with about a quarter sized splotch of some irregular hyperpigmented macules.  Nontender.  No  erythema, no vesicles, no hives. Both pretibial surfaces with hypertrophic patches of dry skin without erythema.  LABS:  Last CBC Lab Results  Component Value Date   WBC 6.2 07/23/2022   HGB 14.3 07/23/2022   HCT 43.4 07/23/2022   MCV 80.4 07/23/2022   MCH 26.1 (L) 03/25/2018   RDW 14.0 07/23/2022   PLT 213.0 07/23/2022   Last metabolic panel Lab Results  Component Value Date   GLUCOSE 106 (H) 07/23/2022   NA 138 07/23/2022   K 4.2 07/23/2022   CL 102 07/23/2022   CO2 28 07/23/2022   BUN 17 07/23/2022   CREATININE 1.10 07/23/2022   CALCIUM 9.5 07/23/2022   PROT 7.2 07/23/2022   ALBUMIN 4.1 07/23/2022   BILITOT 0.7 07/23/2022   ALKPHOS 58 07/23/2022   AST 24 07/23/2022   ALT 26 07/23/2022   IMPRESSION AND PLAN:  #1 left lower quadrant rash recently--his description is consistent with zoster. Resolved. He has had 1 Shingrix about 3 months ago.  He will come back in 6 to 8 weeks to get his second shot.  #2 vasovagal episode.  Unknown etiology.  Reassured for now, no workup.  #3 dry skin both shins. Derm referral ordered today.  An After Visit  Summary was printed and given to the patient.  FOLLOW UP: No follow-ups on file.  Signed:  Santiago Bumpers, MD           10/28/2022

## 2022-10-30 ENCOUNTER — Ambulatory Visit: Payer: BC Managed Care – PPO

## 2022-11-19 ENCOUNTER — Other Ambulatory Visit: Payer: Self-pay

## 2022-11-19 MED ORDER — ALLOPURINOL 100 MG PO TABS: ORAL_TABLET | ORAL | 1 refills | Status: DC

## 2022-12-21 ENCOUNTER — Ambulatory Visit (INDEPENDENT_AMBULATORY_CARE_PROVIDER_SITE_OTHER): Payer: BC Managed Care – PPO

## 2022-12-21 DIAGNOSIS — Z23 Encounter for immunization: Secondary | ICD-10-CM

## 2022-12-31 DIAGNOSIS — M79645 Pain in left finger(s): Secondary | ICD-10-CM | POA: Diagnosis not present

## 2022-12-31 DIAGNOSIS — T881XXA Other complications following immunization, not elsewhere classified, initial encounter: Secondary | ICD-10-CM | POA: Diagnosis not present

## 2023-01-01 ENCOUNTER — Ambulatory Visit: Payer: BC Managed Care – PPO | Admitting: Family Medicine

## 2023-02-19 ENCOUNTER — Ambulatory Visit (INDEPENDENT_AMBULATORY_CARE_PROVIDER_SITE_OTHER): Payer: BC Managed Care – PPO | Admitting: Family Medicine

## 2023-02-19 ENCOUNTER — Encounter: Payer: Self-pay | Admitting: Family Medicine

## 2023-02-19 VITALS — BP 132/85 | HR 68 | Wt 230.0 lb

## 2023-02-19 DIAGNOSIS — M65849 Other synovitis and tenosynovitis, unspecified hand: Secondary | ICD-10-CM | POA: Diagnosis not present

## 2023-02-19 DIAGNOSIS — M659 Synovitis and tenosynovitis, unspecified: Secondary | ICD-10-CM

## 2023-02-19 DIAGNOSIS — M1000 Idiopathic gout, unspecified site: Secondary | ICD-10-CM | POA: Diagnosis not present

## 2023-02-19 DIAGNOSIS — Z23 Encounter for immunization: Secondary | ICD-10-CM

## 2023-02-19 DIAGNOSIS — R7301 Impaired fasting glucose: Secondary | ICD-10-CM

## 2023-02-19 MED ORDER — PREDNISONE 10 MG PO TABS: ORAL_TABLET | ORAL | 0 refills | Status: DC

## 2023-02-19 NOTE — Progress Notes (Signed)
OFFICE VISIT  02/19/2023  CC:  Chief Complaint  Patient presents with   Toe Pain    Last 4-5 days pt states his R big toe; pain and swelling. Pt went to urgent care and was given a steroid shot and has been using Advil. Pt noticed change until last week when issue started.     Patient is a 52 y.o. male who presents for right big toe pain.  HPI: Couple of weeks ago he had pain across the top of his left thumb over the MCP joint. No significant swelling or redness.  It hurts to bend it. Is right toe has been hurting on and off but is getting more persistent.  At the beginning of this he went to an urgent care and was given a steroid shot and this led to some improvement but symptoms have returned.  At night his pain is worse and he says the covers on his bed irritate his toe significantly. No fever, no malaise.  He has not changed his diet lately.  He eats a good low purine diet. No other joints are giving him problems.   Past Medical History:  Diagnosis Date   Gout 2019/2020   History of adenomatous polyp of colon    Hypertriglyceridemia 2014    Past Surgical History:  Procedure Laterality Date   COLONOSCOPY  04/2020   adenoma x 1, recall 04/2025    Outpatient Medications Prior to Visit  Medication Sig Dispense Refill   allopurinol (ZYLOPRIM) 100 MG tablet TAKE 1 TABLET(100 MG) BY MOUTH DAILY 90 tablet 1   Multiple Vitamins-Minerals (MULTIVITAMIN MEN) TABS Take by mouth daily.     No facility-administered medications prior to visit.    Allergies  Allergen Reactions   Cheese Nausea Only    Blue cheese    Review of Systems  As per HPI  PE:    02/19/2023    3:29 PM 10/28/2022    8:22 AM 07/23/2022    8:57 AM  Vitals with BMI  Height  6\' 1"  6\' 1"   Weight 230 lbs 232 lbs 13 oz 235 lbs 6 oz  BMI  30.72 31.06  Systolic 132 129 409  Diastolic 85 85 73  Pulse 68 50 62   Physical Exam  Gen: Alert, well appearing.  Patient is oriented to person, place, time, and  situation. AFFECT: pleasant, lucid thought and speech. Left thumb with mild tenderness to palpation over the dorsal aspect of the MCP joint. No swelling or erythema or warmth. Right great toe with some tenderness to palpation over the dorsal aspect of the IP joint with accompanying warmth.  Range of motion intact but flexion causes worsening of the pain.  No erythema.  No obvious soft tissue swelling.  Range of motion intact.  LABS:  Last metabolic panel Lab Results  Component Value Date   GLUCOSE 106 (H) 07/23/2022   NA 138 07/23/2022   K 4.2 07/23/2022   CL 102 07/23/2022   CO2 28 07/23/2022   BUN 17 07/23/2022   CREATININE 1.10 07/23/2022   GFR 78.28 07/23/2022   CALCIUM 9.5 07/23/2022   PROT 7.2 07/23/2022   ALBUMIN 4.1 07/23/2022   BILITOT 0.7 07/23/2022   ALKPHOS 58 07/23/2022   AST 24 07/23/2022   ALT 26 07/23/2022   Lab Results  Component Value Date   LABURIC 7.1 07/23/2022   IMPRESSION AND PLAN:  #1 gout tenosynovitis of the left thumb extensor tendon sheath at the level of the MCP joint.  Additionally, similar tenosynovitis of the extensor tendon of the right big toe at the level of the IP joint. [facility] ultrasound today: There is some anechoic fluid within the tendon sheath of the extensor hallucis longus at the level of the proximal phalanx of the right big toe extending up just proximal to the IP joint.  The IP joint itself has no fluid or synovitis changes.  No hyperemia or bony erosion.  No crystals. Similar findings, although more subtle, are noted in the extensor pollicis longus sheath as it tracks over the MCP joint of the left thumb.  MCP joint of left thumb without synovitis or fluid.  Plan: Prednisone 30 mg x 4 days, 20 mg x 4 days, 10 mg x 4 days. Check uric acid level. This is his first gout attack in a long time on the 100 mg allopurinol daily. Continue current dose for now. If he does not improve significantly then he will return and we will consider  tendon sheath steroid injection.  #2 impaired fasting glucose. He requests hemoglobin A1c today.  An After Visit Summary was printed and given to the patient.  FOLLOW UP: Return if symptoms worsen or fail to improve.  Signed:  Santiago Bumpers, MD           02/19/2023

## 2023-02-20 LAB — HEMOGLOBIN A1C
Hgb A1c MFr Bld: 6.7 %{Hb} — ABNORMAL HIGH (ref <5.7)
Mean Plasma Glucose: 146 mg/dL
eAG (mmol/L): 8.1 mmol/L

## 2023-02-20 LAB — URIC ACID: Uric Acid, Serum: 6.3 mg/dL (ref 4.0–8.0)

## 2023-04-01 ENCOUNTER — Ambulatory Visit: Payer: BC Managed Care – PPO | Admitting: Family Medicine

## 2023-04-01 ENCOUNTER — Telehealth: Payer: Self-pay

## 2023-04-01 ENCOUNTER — Encounter: Payer: Self-pay | Admitting: Family Medicine

## 2023-04-01 VITALS — BP 118/80 | HR 64 | Wt 233.0 lb

## 2023-04-01 DIAGNOSIS — E119 Type 2 diabetes mellitus without complications: Secondary | ICD-10-CM

## 2023-04-01 MED ORDER — FREESTYLE LIBRE 3 PLUS SENSOR MISC: 11 refills | Status: DC

## 2023-04-01 NOTE — Addendum Note (Signed)
Addended by: Filomena Jungling on: 04/01/2023 03:50 PM   Modules accepted: Orders

## 2023-04-01 NOTE — Telephone Encounter (Addendum)
Pt is not on insulin so insurance will not cover. I attempted to do parachute order but it would not complete due to pt not being on insulin. I have sent a Rx to his pharmacy.

## 2023-04-01 NOTE — Telephone Encounter (Signed)
noted 

## 2023-04-01 NOTE — Progress Notes (Signed)
OFFICE VISIT  04/01/2023  CC:  Chief Complaint  Patient presents with   Discuss Labwork    Pt states he got labwork done a few weeks ago and his A1c was not at goal. He was told by Dr. Milinda Cave to come in a discuss.   Patient is a 52 y.o. male who presents for f/u new dx DM. Hemoglobin A1c was 6.7% approximately 5 weeks ago. I checked this lab because he requested it when he was here for an office visit for gout.  No known history of elevated glucose prior to that time. No medications started.  He is here today to further discuss.  INTERIM HX: Fredi feels well. No polyuria, polydipsia, or polyphagia. He travels a lot for his work and therefore has some trouble maintaining a healthy diet. He walks about 5 miles every day.  He recently joined a gym.  Past Medical History:  Diagnosis Date   Gout 2019/2020   History of adenomatous polyp of colon    Hypertriglyceridemia 2014    Past Surgical History:  Procedure Laterality Date   COLONOSCOPY  04/2020   adenoma x 1, recall 04/2025    Outpatient Medications Prior to Visit  Medication Sig Dispense Refill   allopurinol (ZYLOPRIM) 100 MG tablet TAKE 1 TABLET(100 MG) BY MOUTH DAILY 90 tablet 1   Multiple Vitamins-Minerals (MULTIVITAMIN MEN) TABS Take by mouth daily.     predniSONE (DELTASONE) 10 MG tablet 3 tabs p.o. daily x 4 days, then 2 tabs p.o. daily x 4 days, then 1 tab p.o. daily x 4 days 24 tablet 0   No facility-administered medications prior to visit.    Allergies  Allergen Reactions   Cheese Nausea Only    Blue cheese    Review of Systems As per HPI  PE:    04/01/2023    2:56 PM 02/19/2023    3:29 PM 10/28/2022    8:22 AM  Vitals with BMI  Height   6\' 1"   Weight 233 lbs 230 lbs 232 lbs 13 oz  BMI   30.72  Systolic 118 132 161  Diastolic 80 85 85  Pulse 64 68 50     Physical Exam  Gen: Alert, well appearing.  Patient is oriented to person, place, time, and situation. AFFECT: pleasant, lucid thought and  speech. No further exam today  LABS:  Last CBC Lab Results  Component Value Date   WBC 6.2 07/23/2022   HGB 14.3 07/23/2022   HCT 43.4 07/23/2022   MCV 80.4 07/23/2022   MCH 26.1 (L) 03/25/2018   RDW 14.0 07/23/2022   PLT 213.0 07/23/2022   Last metabolic panel Lab Results  Component Value Date   GLUCOSE 106 (H) 07/23/2022   NA 138 07/23/2022   K 4.2 07/23/2022   CL 102 07/23/2022   CO2 28 07/23/2022   BUN 17 07/23/2022   CREATININE 1.10 07/23/2022   GFR 78.28 07/23/2022   CALCIUM 9.5 07/23/2022   PROT 7.2 07/23/2022   ALBUMIN 4.1 07/23/2022   BILITOT 0.7 07/23/2022   ALKPHOS 58 07/23/2022   AST 24 07/23/2022   ALT 26 07/23/2022   Last hemoglobin A1c Lab Results  Component Value Date   HGBA1C 6.7 (H) 02/19/2023   Lab Results  Component Value Date   LABURIC 6.3 02/19/2023   IMPRESSION AND PLAN:  Newly diagnosed diabetes. A1c 6.7% about 5 weeks ago. Discussed general dietary and exercise recommendations, standard monitoring with A1c, urine microalbumin, annual eye exam, annual foot exam. No  medications at this time. Nutritionist referral today. I think he would do great with a continuous glucose monitor.  An After Visit Summary was printed and given to the patient.  FOLLOW UP: Return in about 2 months (around 06/01/2023) for Follow-up diabetes.  Signed:  Santiago Bumpers, MD           04/01/2023

## 2023-05-03 DIAGNOSIS — H16142 Punctate keratitis, left eye: Secondary | ICD-10-CM | POA: Diagnosis not present

## 2023-05-03 DIAGNOSIS — H11152 Pinguecula, left eye: Secondary | ICD-10-CM | POA: Diagnosis not present

## 2023-05-03 DIAGNOSIS — H11132 Conjunctival pigmentations, left eye: Secondary | ICD-10-CM | POA: Diagnosis not present

## 2023-05-24 DIAGNOSIS — H16142 Punctate keratitis, left eye: Secondary | ICD-10-CM | POA: Diagnosis not present

## 2023-05-24 DIAGNOSIS — H11132 Conjunctival pigmentations, left eye: Secondary | ICD-10-CM | POA: Diagnosis not present

## 2023-05-24 DIAGNOSIS — H11152 Pinguecula, left eye: Secondary | ICD-10-CM | POA: Diagnosis not present

## 2023-05-27 ENCOUNTER — Encounter: Payer: BC Managed Care – PPO | Attending: Family Medicine | Admitting: Dietician

## 2023-05-27 VITALS — Ht 73.0 in | Wt 223.0 lb

## 2023-05-27 DIAGNOSIS — E119 Type 2 diabetes mellitus without complications: Secondary | ICD-10-CM | POA: Diagnosis not present

## 2023-06-01 ENCOUNTER — Encounter: Payer: Self-pay | Admitting: Dietician

## 2023-06-01 NOTE — Progress Notes (Signed)
 Patient was seen on 05/26/2022 for the first of a series of three diabetes self-management courses at the [facility].  Patient Education Plan per assessed needs and concerns is to attend three course education program for Diabetes Self Management Education.  A1C was 6.7% on 01/2023.  The following learning objectives were met by the patient during this class: Describe diabetes, types of diabetes and pathophysiology State some common risk factors for diabetes Defines the role of glucose and insulin Describe the relationship between diabetes and cardiovascular and other risks State the members of the Healthcare Team States the rationale for glucose monitoring and when to test State their individual Target Range State the importance of logging glucose readings and how to interpret the readings Identifies A1C target Explain the correlation between A1c and eAG values State symptoms and treatment of high blood glucose and low blood glucose Explain proper technique for glucose testing and identify proper sharps disposal  Handouts given during class include: How to Thrive:  A Guide for Your Journey with Diabetes by the ADA Meal Plan Card and carbohydrate content list Dietary intake form Low Sodium Flavoring Tips Types of Fats Dining Out Label reading Snack list The diabetes portion plate Diabetes Resources A1c to eAG Conversion Chart Blood Glucose Log Diabetes Recommended Care Schedule Support Group Diabetes Success Plan Core Class Satisfaction Survey   Follow-Up Plan: Attend core 2

## 2023-06-03 ENCOUNTER — Encounter: Payer: BC Managed Care – PPO | Attending: Family Medicine | Admitting: Dietician

## 2023-06-03 DIAGNOSIS — E119 Type 2 diabetes mellitus without complications: Secondary | ICD-10-CM

## 2023-06-04 ENCOUNTER — Ambulatory Visit: Payer: BC Managed Care – PPO | Admitting: Family Medicine

## 2023-06-04 NOTE — Progress Notes (Deleted)
 OFFICE VISIT  06/04/2023  CC: No chief complaint on file.   Patient is a 53 y.o. male who presents for 23-month follow-up diabetes. A/P as of last visit: Newly diagnosed diabetes. A1c 6.7% about 5 weeks ago. Discussed general dietary and exercise recommendations, standard monitoring with A1c, urine microalbumin, annual eye exam, annual foot exam. No medications at this time. Nutritionist referral today. I think he would do great with a continuous glucose monitor.  INTERIM HX: ***  Past Medical History:  Diagnosis Date   Diabetes mellitus without complication (HCC)    Gout 2019/2020   History of adenomatous polyp of colon    Hypertriglyceridemia 2014    Past Surgical History:  Procedure Laterality Date   COLONOSCOPY  04/2020   adenoma x 1, recall 04/2025    Outpatient Medications Prior to Visit  Medication Sig Dispense Refill   allopurinol  (ZYLOPRIM ) 100 MG tablet TAKE 1 TABLET(100 MG) BY MOUTH DAILY 90 tablet 1   Continuous Glucose Sensor (FREESTYLE LIBRE 3 PLUS SENSOR) MISC Change sensor every 15 days. 2 each 11   Multiple Vitamins-Minerals (MULTIVITAMIN MEN) TABS Take by mouth daily.     No facility-administered medications prior to visit.    Allergies  Allergen Reactions   Cheese Nausea Only    Blue cheese    Review of Systems As per HPI  PE:    06/01/2023    5:01 PM 04/01/2023    2:56 PM 02/19/2023    3:29 PM  Vitals with BMI  Height 6' 1    Weight 223 lbs 233 lbs 230 lbs  BMI 29.43    Systolic  118 132  Diastolic  80 85  Pulse  64 68     Physical Exam  ***  LABS:  Last metabolic panel Lab Results  Component Value Date   GLUCOSE 106 (H) 07/23/2022   NA 138 07/23/2022   K 4.2 07/23/2022   CL 102 07/23/2022   CO2 28 07/23/2022   BUN 17 07/23/2022   CREATININE 1.10 07/23/2022   GFR 78.28 07/23/2022   CALCIUM  9.5 07/23/2022   PROT 7.2 07/23/2022   ALBUMIN 4.1 07/23/2022   BILITOT 0.7 07/23/2022   ALKPHOS 58 07/23/2022   AST 24  07/23/2022   ALT 26 07/23/2022   Last lipids Lab Results  Component Value Date   CHOL 193 07/23/2022   HDL 36.70 (L) 07/23/2022   LDLCALC 124 (H) 07/23/2022   TRIG 165.0 (H) 07/23/2022   CHOLHDL 5 07/23/2022   Last hemoglobin A1c Lab Results  Component Value Date   HGBA1C 6.7 (H) 02/19/2023   IMPRESSION AND PLAN:  No problem-specific Assessment & Plan notes found for this encounter.   An After Visit Summary was printed and given to the patient.  FOLLOW UP: No follow-ups on file.  Signed:  Gerlene Hockey, MD           06/04/2023

## 2023-06-07 ENCOUNTER — Encounter: Payer: Self-pay | Admitting: Dietician

## 2023-06-07 NOTE — Progress Notes (Signed)
 Patient was seen on 06/02/2022 for the second of a series of three diabetes self-management courses at the [facility]. The following learning objectives were met by the patient during this class:  Describe the role of different macronutrients on glucose Explain how carbohydrates affect blood glucose State what foods contain the most carbohydrates Demonstrate carbohydrate counting Demonstrate how to read Nutrition Facts food label Describe effects of various fats on heart health Describe the importance of good nutrition for health and healthy eating strategies Describe techniques for managing your shopping, cooking and meal planning List strategies to follow meal plan when dining out Describe the effects of alcohol on glucose and how to use it safely E-mailed patient a copy of an [employer] food diabetes book  Goals:  Follow Diabetes Meal Plan as instructed  Aim to spread carbs evenly throughout the day  Aim for 3 meals per day and snacks as needed Include lean protein foods to meals/snacks  Monitor glucose levels as instructed by your doctor   Follow-Up Plan: Attend Core 3 Work towards following your personal food plan.

## 2023-06-10 ENCOUNTER — Ambulatory Visit: Payer: BC Managed Care – PPO

## 2023-06-16 ENCOUNTER — Encounter: Payer: Self-pay | Admitting: Family Medicine

## 2023-06-16 ENCOUNTER — Ambulatory Visit: Payer: BC Managed Care – PPO | Admitting: Family Medicine

## 2023-06-16 VITALS — BP 127/82 | HR 59 | Wt 226.0 lb

## 2023-06-16 DIAGNOSIS — E119 Type 2 diabetes mellitus without complications: Secondary | ICD-10-CM | POA: Diagnosis not present

## 2023-06-16 LAB — POCT GLYCOSYLATED HEMOGLOBIN (HGB A1C)
HbA1c POC (<> result, manual entry): 6.2 % (ref 4.0–5.6)
HbA1c, POC (controlled diabetic range): 6.2 % (ref 0.0–7.0)
HbA1c, POC (prediabetic range): 6.2 % (ref 5.7–6.4)
Hemoglobin A1C: 6.2 % — AB (ref 4.0–5.6)

## 2023-06-16 NOTE — Progress Notes (Signed)
OFFICE VISIT  06/16/2023  CC:  Chief Complaint  Patient presents with   Diabetes    Patient is a 53 y.o. male who presents for f/u DM. A/P as of last visit 04/01/23: "Newly diagnosed diabetes. A1c 6.7% about 5 weeks ago. Discussed general dietary and exercise recommendations, standard monitoring with A1c, urine microalbumin, annual eye exam, annual foot exam. No medications at this time. Nutritionist referral today. I think he would do great with a continuous glucose monitor"  INTERIM HX: Austin Sanchez feels great. He has lost 10 pounds in the last month or so. Has made some great dietary changes.  He uses his CGM and finds it very useful to see what foods spike his sugar.  Past Medical History:  Diagnosis Date   Diabetes mellitus without complication (HCC)    Gout 2019/2020   History of adenomatous polyp of colon    Hypertriglyceridemia 2014    Past Surgical History:  Procedure Laterality Date   COLONOSCOPY  04/2020   adenoma x 1, recall 04/2025    Outpatient Medications Prior to Visit  Medication Sig Dispense Refill   allopurinol (ZYLOPRIM) 100 MG tablet TAKE 1 TABLET(100 MG) BY MOUTH DAILY 90 tablet 1   Continuous Glucose Sensor (FREESTYLE LIBRE 3 PLUS SENSOR) MISC Change sensor every 15 days. 2 each 11   Multiple Vitamins-Minerals (MULTIVITAMIN MEN) TABS Take by mouth daily.     No facility-administered medications prior to visit.    Allergies  Allergen Reactions   Cheese Nausea Only    Blue cheese    Review of Systems As per HPI  PE:    06/16/2023    2:53 PM 06/01/2023    5:01 PM 04/01/2023    2:56 PM  Vitals with BMI  Height  6\' 1"    Weight 226 lbs 223 lbs 233 lbs  BMI 29.82 29.43   Systolic 127  118  Diastolic 82  80  Pulse 59  64     Physical Exam  Gen: Alert, well appearing.  Patient is oriented to person, place, time, and situation. AFFECT: pleasant, lucid thought and speech. No further exam today  LABS:  Last CBC Lab Results  Component  Value Date   WBC 6.2 07/23/2022   HGB 14.3 07/23/2022   HCT 43.4 07/23/2022   MCV 80.4 07/23/2022   MCH 26.1 (L) 03/25/2018   RDW 14.0 07/23/2022   PLT 213.0 07/23/2022   Last metabolic panel Lab Results  Component Value Date   GLUCOSE 106 (H) 07/23/2022   NA 138 07/23/2022   K 4.2 07/23/2022   CL 102 07/23/2022   CO2 28 07/23/2022   BUN 17 07/23/2022   CREATININE 1.10 07/23/2022   GFR 78.28 07/23/2022   CALCIUM 9.5 07/23/2022   PROT 7.2 07/23/2022   ALBUMIN 4.1 07/23/2022   BILITOT 0.7 07/23/2022   ALKPHOS 58 07/23/2022   AST 24 07/23/2022   ALT 26 07/23/2022   Lab Results  Component Value Date   LABURIC 6.3 02/19/2023   Last lipids Lab Results  Component Value Date   CHOL 193 07/23/2022   HDL 36.70 (L) 07/23/2022   LDLCALC 124 (H) 07/23/2022   TRIG 165.0 (H) 07/23/2022   CHOLHDL 5 07/23/2022   Last hemoglobin A1c Lab Results  Component Value Date   HGBA1C 6.7 (H) 02/19/2023   Last thyroid functions Lab Results  Component Value Date   TSH 2.09 07/23/2022   IMPRESSION AND PLAN:  Diabetes without complication.  Diet controlled. POC Hba1c today is improved  from 6.7% to 6.2%. Urine microalbumin/creatinine today. Feet exam normal today.  An After Visit Summary was printed and given to the patient.  FOLLOW UP: Return in about 3 months (around 09/14/2023) for annual CPE (fasting).   Signed:  Santiago Bumpers, MD           06/16/2023

## 2023-06-17 LAB — URINALYSIS, ROUTINE W REFLEX MICROSCOPIC
Bilirubin Urine: NEGATIVE
Hgb urine dipstick: NEGATIVE
Ketones, ur: NEGATIVE
Leukocytes,Ua: NEGATIVE
Nitrite: NEGATIVE
RBC / HPF: NONE SEEN (ref 0–?)
Specific Gravity, Urine: 1.015 (ref 1.000–1.030)
Total Protein, Urine: NEGATIVE
Urine Glucose: 500 — AB
Urobilinogen, UA: 0.2 (ref 0.0–1.0)
pH: 7 (ref 5.0–8.0)

## 2023-07-21 ENCOUNTER — Ambulatory Visit: Payer: BC Managed Care – PPO | Admitting: Family Medicine

## 2023-07-21 ENCOUNTER — Ambulatory Visit: Payer: Self-pay | Admitting: Family Medicine

## 2023-07-21 ENCOUNTER — Encounter: Payer: Self-pay | Admitting: Family Medicine

## 2023-07-21 VITALS — BP 126/75 | HR 66 | Temp 98.0°F | Wt 223.2 lb

## 2023-07-21 DIAGNOSIS — R051 Acute cough: Secondary | ICD-10-CM

## 2023-07-21 DIAGNOSIS — J9801 Acute bronchospasm: Secondary | ICD-10-CM | POA: Diagnosis not present

## 2023-07-21 MED ORDER — METHYLPREDNISOLONE ACETATE 80 MG/ML IJ SUSP
80.0000 mg | Freq: Once | INTRAMUSCULAR | Status: AC
Start: 2023-07-21 — End: 2023-07-21
  Administered 2023-07-21: 80 mg via INTRAMUSCULAR

## 2023-07-21 MED ORDER — ALBUTEROL SULFATE HFA 108 (90 BASE) MCG/ACT IN AERS: 2.0000 | INHALATION_SPRAY | Freq: Four times a day (QID) | RESPIRATORY_TRACT | 0 refills | Status: AC | PRN

## 2023-07-21 NOTE — Progress Notes (Signed)
 Acute cough OFFICE VISIT  07/21/2023  CC:  Chief Complaint  Patient presents with   Cough    Ongoing for 3-4 weeks; noticed some congestion with cough yesterday    Patient is a 53 y.o. male who presents for respiratory concern.  HPI: Austin Sanchez had an upper respiratory viral infection 4 weeks ago.  The cough has lingered a little bit and then last night got significantly acutely worse at night.  Felt like there was something clogging his upper airway.  A little bit of mucus came out at times but for the most part his cough is dry.  His voice has been a bit hoarse this morning at times. Denies wheezing or shortness of breath or fever. He has a little nasal dryness of the last couple days but no significant postnasal drip or mucus production. He does have a history of nocturnal episodes of LPR but says this feels different.  Past Medical History:  Diagnosis Date   Diabetes mellitus without complication (HCC)    Gout 2019/2020   History of adenomatous polyp of colon    Hypertriglyceridemia 2014    Past Surgical History:  Procedure Laterality Date   COLONOSCOPY  04/2020   adenoma x 1, recall 04/2025    Outpatient Medications Prior to Visit  Medication Sig Dispense Refill   allopurinol (ZYLOPRIM) 100 MG tablet TAKE 1 TABLET(100 MG) BY MOUTH DAILY 90 tablet 1   Continuous Glucose Sensor (FREESTYLE LIBRE 3 PLUS SENSOR) MISC Change sensor every 15 days. 2 each 11   Multiple Vitamins-Minerals (MULTIVITAMIN MEN) TABS Take by mouth daily.     No facility-administered medications prior to visit.    Allergies  Allergen Reactions   Cheese Nausea Only    Blue cheese    Review of Systems  As per HPI  PE:    07/21/2023    1:59 PM 06/16/2023    2:53 PM 06/01/2023    5:01 PM  Vitals with BMI  Height   6\' 1"   Weight 223 lbs 3 oz 226 lbs 223 lbs  BMI  29.82 29.43  Systolic 126 127   Diastolic 75 82   Pulse 66 59     Physical Exam  Gen: Alert, well appearing.  Patient is  oriented to person, place, time, and situation. AFFECT: pleasant, lucid thought and speech. UJW:JXBJ: no injection, icteris, swelling, or exudate.  EOMI, PERRLA. Mouth: lips without lesion/swelling.  Oral mucosa pink and moist. Oropharynx without erythema, exudate, or swelling.  CV: RRR, no m/r/g.   LUNGS: CTA bilat, nonlabored resps, good aeration in all lung fields. EXT: no clubbing or cyanosis.  no edema.   LABS:  Last metabolic panel Lab Results  Component Value Date   GLUCOSE 106 (H) 07/23/2022   NA 138 07/23/2022   K 4.2 07/23/2022   CL 102 07/23/2022   CO2 28 07/23/2022   BUN 17 07/23/2022   CREATININE 1.10 07/23/2022   GFR 78.28 07/23/2022   CALCIUM 9.5 07/23/2022   PROT 7.2 07/23/2022   ALBUMIN 4.1 07/23/2022   BILITOT 0.7 07/23/2022   ALKPHOS 58 07/23/2022   AST 24 07/23/2022   ALT 26 07/23/2022   Last hemoglobin A1c Lab Results  Component Value Date   HGBA1C 6.2 (A) 06/16/2023   HGBA1C 6.2 06/16/2023   HGBA1C 6.2 06/16/2023   HGBA1C 6.2 06/16/2023   IMPRESSION AND PLAN:  URI with residual cough. Suspect some reactive airways due to LPR episode last night. He does have history of bronchitis which has  required a steroid in the remote past.  Will go ahead and give Depo-Medrol 80 mg IM in the office today and get him an albuterol inhaler to use 2 puffs every 6 hours as needed.   An After Visit Summary was printed and given to the patient.  FOLLOW UP: Return if symptoms worsen or fail to improve.  Signed:  Santiago Bumpers, MD           07/21/2023

## 2023-07-21 NOTE — Telephone Encounter (Signed)
 No further action needed. Pt scheduled today for ov

## 2023-07-21 NOTE — Telephone Encounter (Addendum)
 Copied from CRM [id number]. Topic: Clinical - Red Word Triage >> Jul 21, 2023 12:33 PM Alvino Blood C wrote: Red Word that prompted transfer to Nurse Triage: Patient states he's having a hard time breathing and his cough has significantly increased. He woke up in the middle of the night because of a blockage in his throat   Chief Complaint: Cough, congestion mild SOB Pertinent Negatives: Patient denies fever Disposition: [] ED /[] Urgent Care (no appt availability in office) / [x] Appointment(In office/virtual)/ []  [facility]/ [] Home Care/ [] Refused Recommended Disposition /[] [facility] [facility]/ []  Follow-up with PCP Additional Notes: Patient stated he had a URI a month ago. He has a  lingering cough, congestion, and last night he started having SOB. He stated he has no SOB at this moment, but he can get mild SOB with activity. He stated he has been taking Mucinex. Appointment was scheduled for tomorrow. Instructed patient to go to urgent care today if having worsening symptoms. After this call ended, this RN noticed that patient was scheduled for today by someone else. Unknown if patient is aware of this appointment for this afternoon. Attempted to call patient back multiple times to see if he knows about the appt today but could not reach him.  Update** I contacted the agent who scheduled today's appt. She stated that she scheduled this appointment while speaking to patient's wife and the wife said that she would let patient know about the appointment today. If patient shows up to the appointment today, please cancel the one for tomorrow.   Answer Assessment - Initial Assessment Questions 1. RESPIRATORY STATUS: "Describe your breathing?" (e.g., wheezing, shortness of breath, unable to speak, severe coughing)      Last night, patient had an episode where he was coughing and having SOB. He felt like he had a blockage and then he felt better  2. ONSET: "When did this breathing problem begin?"       Last night  3. PATTERN "Does the difficult breathing come and go, or has it been constant since it started?"      Comes and goes  4. SEVERITY: "How bad is your breathing?" (e.g., mild, moderate, severe)    - MILD: No SOB at rest, mild SOB with walking, speaks normally in sentences, can lie down, no retractions, pulse < 100.    - MODERATE: SOB at rest, SOB with minimal exertion and prefers to sit, cannot lie down flat, speaks in phrases, mild retractions, audible wheezing, pulse 100-120.    - SEVERE: Very SOB at rest, speaks in single words, struggling to breathe, sitting hunched forward, retractions, pulse > 120      No SOB now. Only mild SOB with activity  5. RECURRENT SYMPTOM: "Have you had difficulty breathing before?" If Yes, ask: "When was the last time?" and "What happened that time?"      No  6. LUNG HISTORY: "Do you have any history of lung disease?"  (e.g., pulmonary embolus, asthma, emphysema)     No  Protocols used: Breathing Difficulty-A-AH

## 2023-07-22 ENCOUNTER — Ambulatory Visit: Payer: BC Managed Care – PPO | Admitting: Family Medicine

## 2023-07-26 ENCOUNTER — Encounter: Payer: BC Managed Care – PPO | Admitting: Family Medicine

## 2023-08-16 ENCOUNTER — Ambulatory Visit: Payer: BC Managed Care – PPO | Admitting: Dermatology

## 2023-09-03 ENCOUNTER — Ambulatory Visit: Payer: Self-pay

## 2023-09-03 ENCOUNTER — Ambulatory Visit: Admitting: Family

## 2023-09-03 DIAGNOSIS — J020 Streptococcal pharyngitis: Secondary | ICD-10-CM | POA: Diagnosis not present

## 2023-09-03 DIAGNOSIS — R42 Dizziness and giddiness: Secondary | ICD-10-CM | POA: Diagnosis not present

## 2023-09-03 DIAGNOSIS — Z136 Encounter for screening for cardiovascular disorders: Secondary | ICD-10-CM | POA: Diagnosis not present

## 2023-09-03 DIAGNOSIS — Z03818 Encounter for observation for suspected exposure to other biological agents ruled out: Secondary | ICD-10-CM | POA: Diagnosis not present

## 2023-09-03 NOTE — Telephone Encounter (Signed)
 No further action needed.

## 2023-09-03 NOTE — Telephone Encounter (Signed)
 Copied from CRM [id number]. Topic: General - Other >> Sep 03, 2023  4:04 PM Truddie Crumble wrote: Reason for CRM: patient wife called stating the pateint went to urgent care instead of going to his appointment today at 4 because he just did not feel good

## 2023-09-03 NOTE — Telephone Encounter (Signed)
  Chief Complaint: dizzy Symptoms: dizzy Frequency: intermittent Pertinent Negatives: Patient denies all other symptoms.  Disposition: [] ED /[] Urgent Care (no appt availability in office) / [x] Appointment(In office/virtual)/ []  [facility]/ [] Home Care/ [] Refused Recommended Disposition /[] [facility]/ []  Follow-up with PCP Additional Notes:  Spouse calling to schedule appointment for dizziness and to review CGM. CGM occasionally will beep low, she is unsure the lowest level. Reported he has been having dizzy spells intermittently for two weeks. Had dizziness this morning that has now resolved. Sugar not low at this time.  Dizziness has become more frequent. He has not been evaluated for dizziness.  No acute visits available with PCP or at practice location, scheduled acute visit with alternate provider at alternate practice location. Provided spouse with practice address and provider name. Educated on care advice as documented in protocol, patient verbalized understanding. Discussed reasons to call back.      Copied from CRM [id number]. Topic: Clinical - Red Word Triage >> Sep 03, 2023 10:17 AM Gurney Maxin H wrote: [facility] that prompted transfer to Nurse Triage: Patient feeling dizzy and low sugar levels Reason for Disposition  [1] MODERATE dizziness (e.g., interferes with normal activities) AND [2] has NOT been evaluated by doctor (or NP/PA) for this  (Exception: Dizziness caused by heat exposure, sudden standing, or poor fluid intake.)  Protocols used: Dizziness - Lightheadedness-A-AH

## 2023-09-06 ENCOUNTER — Ambulatory Visit (INDEPENDENT_AMBULATORY_CARE_PROVIDER_SITE_OTHER): Admitting: Family Medicine

## 2023-09-06 ENCOUNTER — Encounter: Payer: Self-pay | Admitting: Family Medicine

## 2023-09-06 VITALS — BP 124/80 | HR 70 | Ht 73.0 in | Wt 220.6 lb

## 2023-09-06 DIAGNOSIS — R531 Weakness: Secondary | ICD-10-CM

## 2023-09-06 DIAGNOSIS — R42 Dizziness and giddiness: Secondary | ICD-10-CM

## 2023-09-06 DIAGNOSIS — K219 Gastro-esophageal reflux disease without esophagitis: Secondary | ICD-10-CM | POA: Diagnosis not present

## 2023-09-06 DIAGNOSIS — E119 Type 2 diabetes mellitus without complications: Secondary | ICD-10-CM

## 2023-09-06 DIAGNOSIS — R5383 Other fatigue: Secondary | ICD-10-CM

## 2023-09-06 LAB — POCT GLYCOSYLATED HEMOGLOBIN (HGB A1C)
HbA1c POC (<> result, manual entry): 6.2 % (ref 4.0–5.6)
HbA1c, POC (controlled diabetic range): 6.2 % (ref 0.0–7.0)
HbA1c, POC (prediabetic range): 6.2 % (ref 5.7–6.4)
Hemoglobin A1C: 6.2 % — AB (ref 4.0–5.6)

## 2023-09-06 MED ORDER — PANTOPRAZOLE SODIUM 40 MG PO TBEC: 40.0000 mg | DELAYED_RELEASE_TABLET | Freq: Every day | ORAL | 0 refills | Status: DC

## 2023-09-06 NOTE — Progress Notes (Signed)
 OFFICE VISIT  09/06/2023  CC:  Chief Complaint  Patient presents with   Follow-up    Urgent care; went Fri for lightheadedness and fatigue    Patient is a 53 y.o. male who presents for follow-up urgent care visit for lightheadedness and fatigue.  INTERIM HX: 4 days ago he had abrupt onset of lightheadedness.  Lasted approximately 5 seconds.  Not long after this he felt generalized weakness for a while.  He went to the local urgent care, was told his ears look inflamed and his tonsils were a little bit big.  Rapid strep was done and was positive.  He was started on penicillin. Before he left an EKG was done and he was told that had an abnormality but nothing urgent.  He was told to follow-up with me to decide what to do.  I do not have any records at this time. Of note, he did not have ear pain, sore throat, URI symptoms, or cough at that time.  He has not had any recurrence of his lightheadedness or fatigue since then. He has had some brief substernal rising burning sensation as well as some intermittent discomfort in the left upper quadrant in the central thoracic region.  Low intensity, coming and going. He does note that his eating and sleeping schedule has been quite a bit off from his usual of the last month or so.  He had to go to [place] for a few weeks because of his dad's death.  When he got back he had to do lots of traveling around the [place] .    ROS as above, plus--> no fevers, no leg pain or swelling, no SOB, no wheezing, no cough, no HAs, no rashes, no melena/hematochezia.  No polyuria or polydipsia.  No myalgias or arthralgias.  No focal weakness, paresthesias, or tremors.  No acute vision or hearing abnormalities.  No dysuria or unusual/new urinary urgency or frequency.  No recent changes in lower legs. No n/v/d or abd pain.  No palpitations.    Past Medical History:  Diagnosis Date   Diabetes mellitus without complication (HCC)    Gout 2019/2020   History of adenomatous polyp  of colon    Hypertriglyceridemia 2014    Past Surgical History:  Procedure Laterality Date   COLONOSCOPY  04/2020   adenoma x 1, recall 04/2025    Outpatient Medications Prior to Visit  Medication Sig Dispense Refill   allopurinol (ZYLOPRIM) 100 MG tablet TAKE 1 TABLET(100 MG) BY MOUTH DAILY 90 tablet 1   Continuous Glucose Sensor (FREESTYLE LIBRE 3 PLUS SENSOR) MISC Change sensor every 15 days. 2 each 11   Multiple Vitamins-Minerals (MULTIVITAMIN MEN) TABS Take by mouth daily.     penicillin v potassium (VEETID) 500 MG tablet Take 500 mg by mouth 2 (two) times daily.     albuterol (VENTOLIN HFA) 108 (90 Base) MCG/ACT inhaler Inhale 2 puffs into the lungs every 6 (six) hours as needed for wheezing or shortness of breath. (Patient not taking: Reported on 09/06/2023) 8 g 0   No facility-administered medications prior to visit.    Allergies  Allergen Reactions   Cheese Nausea Only    Blue cheese    Review of Systems As per HPI  PE:    09/06/2023    2:44 PM 07/21/2023    1:59 PM 06/16/2023    2:53 PM  Vitals with BMI  Height 6\' 1"     Weight 220 lbs 10 oz 223 lbs 3 oz 226 lbs  BMI 29.11  29.82  Systolic 124 126 098  Diastolic 80 75 82  Pulse 70 66 59     Physical Exam  Gen: Alert, well appearing.  Patient is oriented to person, place, time, and situation. AFFECT: pleasant, lucid thought and speech. ENT: Ears: EACs clear, normal epithelium.  TMs with good light reflex and landmarks bilaterally.  Eyes: no injection, icteris, swelling, or exudate.  EOMI, PERRLA. Nose: no drainage or turbinate edema/swelling.  No injection or focal lesion.  Mouth: lips without lesion/swelling.  Oral mucosa pink and moist.  Dentition intact and without obvious caries or gingival swelling.  Oropharynx without erythema, exudate, or swelling.  CV: RRR, no m/r/g.   LUNGS: CTA bilat, nonlabored resps, good aeration in all lung fields. ABD: soft, NT/ND   LABS:  Last CBC Lab Results  Component  Value Date   WBC 6.2 07/23/2022   HGB 14.3 07/23/2022   HCT 43.4 07/23/2022   MCV 80.4 07/23/2022   MCH 26.1 (L) 03/25/2018   RDW 14.0 07/23/2022   PLT 213.0 07/23/2022   Lab Results  Component Value Date   LABURIC 6.3 02/19/2023   Last metabolic panel Lab Results  Component Value Date   GLUCOSE 106 (H) 07/23/2022   NA 138 07/23/2022   K 4.2 07/23/2022   CL 102 07/23/2022   CO2 28 07/23/2022   BUN 17 07/23/2022   CREATININE 1.10 07/23/2022   GFR 78.28 07/23/2022   CALCIUM 9.5 07/23/2022   PROT 7.2 07/23/2022   ALBUMIN 4.1 07/23/2022   BILITOT 0.7 07/23/2022   ALKPHOS 58 07/23/2022   AST 24 07/23/2022   ALT 26 07/23/2022   Last lipids Lab Results  Component Value Date   CHOL 193 07/23/2022   HDL 36.70 (L) 07/23/2022   LDLCALC 124 (H) 07/23/2022   TRIG 165.0 (H) 07/23/2022   CHOLHDL 5 07/23/2022   Last hemoglobin A1c Lab Results  Component Value Date   HGBA1C 6.2 (A) 09/06/2023   HGBA1C 6.2 09/06/2023   HGBA1C 6.2 09/06/2023   HGBA1C 6.2 09/06/2023   Last thyroid functions Lab Results  Component Value Date   TSH 2.09 07/23/2022   IMPRESSION AND PLAN:  #1 nonspecific dizziness and generalized fatigue. Question of abnormal EKG at urgent care. No alarming symptoms, exam normal. Will look at EKG when we can get this record. Suspect he has had some exhaustion lately. Signs/symptoms to call or return for were reviewed and pt expressed understanding.  #2 diabetes without complication. He feels like he has been eating a lot better and has lost some weight purposefully. Hemoglobin A1c today is stable at 6.2%.  3.  Substernal burning.  He does have a history of reflux. Pantoprazole 40 mg a day x 1 week prescribed.  He will continue this over-the-counter as needed.  An After Visit Summary was printed and given to the patient.  FOLLOW UP: Return if symptoms worsen or fail to improve.  Signed:  Arletha Lady, MD           09/06/2023

## 2023-09-09 ENCOUNTER — Telehealth: Payer: Self-pay | Admitting: Family Medicine

## 2023-09-09 NOTE — Telephone Encounter (Addendum)
 Pt advised of provider instructions. He is still having symptoms while using Pantoprazole so he wants to f/u with PCP. Appt was scheduled

## 2023-09-09 NOTE — Telephone Encounter (Signed)
 Please call patient and tell him I have reviewed his EKG from the urgent care center from 09/03/2023 and it is normal.

## 2023-09-13 ENCOUNTER — Encounter: Payer: Self-pay | Admitting: Family Medicine

## 2023-09-13 ENCOUNTER — Ambulatory Visit (INDEPENDENT_AMBULATORY_CARE_PROVIDER_SITE_OTHER): Admitting: Family Medicine

## 2023-09-13 VITALS — BP 131/77 | HR 60 | Wt 220.8 lb

## 2023-09-13 DIAGNOSIS — E119 Type 2 diabetes mellitus without complications: Secondary | ICD-10-CM | POA: Diagnosis not present

## 2023-09-13 DIAGNOSIS — R42 Dizziness and giddiness: Secondary | ICD-10-CM

## 2023-09-13 DIAGNOSIS — Z125 Encounter for screening for malignant neoplasm of prostate: Secondary | ICD-10-CM | POA: Diagnosis not present

## 2023-09-13 DIAGNOSIS — Z23 Encounter for immunization: Secondary | ICD-10-CM

## 2023-09-13 DIAGNOSIS — Z0000 Encounter for general adult medical examination without abnormal findings: Secondary | ICD-10-CM

## 2023-09-13 DIAGNOSIS — M542 Cervicalgia: Secondary | ICD-10-CM

## 2023-09-13 DIAGNOSIS — K219 Gastro-esophageal reflux disease without esophagitis: Secondary | ICD-10-CM

## 2023-09-13 NOTE — Progress Notes (Signed)
 OFFICE VISIT  09/13/2023  CC:  Chief Complaint  Patient presents with   Dizziness    1 WK F/U. Still c/o dizziness. Mentions discomfort in the back of neck.    Patient is a 53 y.o. male who presents for 1 wk f/u episode of dizziness and fatigue as well as f/u suspected GERD. A/P as of last visit: "1 nonspecific dizziness and generalized fatigue. Question of abnormal EKG at urgent care. No alarming symptoms, exam normal. Will look at EKG when we can get this record. Suspect he has had some exhaustion lately. Signs/symptoms to call or return for were reviewed and pt expressed understanding.   #2 diabetes without complication. He feels like he has been eating a lot better and has lost some weight purposefully. Hemoglobin A1c today is stable at 6.2%.   3.  Substernal burning.  He does have a history of reflux. Pantoprazole  40 mg a day x 1 week prescribed.  He will continue this over-the-counter as needed."  INTERIM HX: Still having some intermittent, brief periods of lightheadedness.  Resolves in 5 seconds or less. Not triggered by any particular head or body motion, except for possibly when looking down it occurs more often. No headache, no spinning sensation, no nausea.  He does generally feel fatigued still. He is epigastric and substernal discomfort is resolved since taking Protonix  for the last week.  Approximately 1 week history of pain around the intersection of the neck and upper back in the midline lately. Started after his last visit with me a week ago. No trauma or acute strain is recalled. He does have a history of cervical radiculopathy over a decade ago.  He has roving, mild, "pinching"-type pain at various areas in the abdomen sometimes after eating.  No nausea, vomiting, or diarrhea. No blood in stool, no black stool.  Review of systems: As above, plus no fever, no exertional chest pain or dizziness.  No palpitations.  Past Medical History:  Diagnosis Date    Diabetes mellitus without complication (HCC)    Gout 2019/2020   History of adenomatous polyp of colon    Hypertriglyceridemia 2014    Past Surgical History:  Procedure Laterality Date   COLONOSCOPY  04/2020   adenoma x 1, recall 04/2025    Outpatient Medications Prior to Visit  Medication Sig Dispense Refill   allopurinol  (ZYLOPRIM ) 100 MG tablet TAKE 1 TABLET(100 MG) BY MOUTH DAILY 90 tablet 1   Continuous Glucose Sensor (FREESTYLE LIBRE 3 PLUS SENSOR) MISC Change sensor every 15 days. 2 each 11   Multiple Vitamins-Minerals (MULTIVITAMIN MEN) TABS Take by mouth daily.     pantoprazole  (PROTONIX ) 40 MG tablet Take 1 tablet (40 mg total) by mouth daily. 7 tablet 0   penicillin v potassium (VEETID) 500 MG tablet Take 500 mg by mouth 2 (two) times daily.     albuterol  (VENTOLIN  HFA) 108 (90 Base) MCG/ACT inhaler Inhale 2 puffs into the lungs every 6 (six) hours as needed for wheezing or shortness of breath. (Patient not taking: Reported on 09/13/2023) 8 g 0   No facility-administered medications prior to visit.    Allergies  Allergen Reactions   Cheese Nausea Only    Blue cheese    Review of Systems As per HPI  PE:    09/13/2023    4:01 PM 09/06/2023    2:44 PM 07/21/2023    1:59 PM  Vitals with BMI  Height  6\' 1"    Weight 220 lbs 13 oz 220 lbs  10 oz 223 lbs 3 oz  BMI 29.14 29.11   Systolic 131 124 161  Diastolic 77 80 75  Pulse 60 70 66   Physical Exam  Gen: Alert, well appearing.  Patient is oriented to person, place, time, and situation. AFFECT: pleasant, lucid thought and speech. Neck: Mild limitation of extension.  Some tightness with rotation but no limitation.  Flexion fully present and lateral bending fully present. He has some tenderness to palpation and soft tissue between scapulae.  Otherwise no tenderness in the back or neck.  LABS:  Last CBC Lab Results  Component Value Date   WBC 6.2 07/23/2022   HGB 14.3 07/23/2022   HCT 43.4 07/23/2022   MCV 80.4  07/23/2022   MCH 26.1 (L) 03/25/2018   RDW 14.0 07/23/2022   PLT 213.0 07/23/2022   Last metabolic panel Lab Results  Component Value Date   GLUCOSE 106 (H) 07/23/2022   NA 138 07/23/2022   K 4.2 07/23/2022   CL 102 07/23/2022   CO2 28 07/23/2022   BUN 17 07/23/2022   CREATININE 1.10 07/23/2022   GFR 78.28 07/23/2022   CALCIUM 9.5 07/23/2022   PROT 7.2 07/23/2022   ALBUMIN 4.1 07/23/2022   BILITOT 0.7 07/23/2022   ALKPHOS 58 07/23/2022   AST 24 07/23/2022   ALT 26 07/23/2022   Last hemoglobin A1c Lab Results  Component Value Date   HGBA1C 6.2 (A) 09/06/2023   HGBA1C 6.2 09/06/2023   HGBA1C 6.2 09/06/2023   HGBA1C 6.2 09/06/2023   Last thyroid  functions Lab Results  Component Value Date   TSH 2.09 07/23/2022   Lab Results  Component Value Date   PSA 0.61 07/23/2022   PSA 0.46 07/21/2021   IMPRESSION AND PLAN:  #1 lightheadedness.  Brief, no clear trigger. No red flag symptoms. Observe clinically at this time.  2.  GERD.  Symptoms resolved with Protonix  40 mg a day for the last week.  He can continue dietary adjustments and use PPI over-the-counter as needed.  #3 preventative health: Prevnar 20 today. He will return for health panel and PSA tomorrow when fasting. Will also get his annual urine microalbumin/creatinine tomorrow.  An After Visit Summary was printed and given to the patient.  FOLLOW UP: Return in about 3 months (around 12/13/2023) for routine chronic illness f/u.  Signed:  Arletha Lady, MD           09/13/2023

## 2023-09-14 ENCOUNTER — Other Ambulatory Visit (INDEPENDENT_AMBULATORY_CARE_PROVIDER_SITE_OTHER)

## 2023-09-14 ENCOUNTER — Encounter: Payer: Self-pay | Admitting: Family Medicine

## 2023-09-14 ENCOUNTER — Other Ambulatory Visit: Payer: Self-pay | Admitting: Family Medicine

## 2023-09-14 DIAGNOSIS — Z0000 Encounter for general adult medical examination without abnormal findings: Secondary | ICD-10-CM | POA: Diagnosis not present

## 2023-09-14 DIAGNOSIS — R42 Dizziness and giddiness: Secondary | ICD-10-CM | POA: Diagnosis not present

## 2023-09-14 DIAGNOSIS — E119 Type 2 diabetes mellitus without complications: Secondary | ICD-10-CM | POA: Diagnosis not present

## 2023-09-14 DIAGNOSIS — Z125 Encounter for screening for malignant neoplasm of prostate: Secondary | ICD-10-CM

## 2023-09-14 DIAGNOSIS — D72825 Bandemia: Secondary | ICD-10-CM

## 2023-09-14 LAB — COMPREHENSIVE METABOLIC PANEL WITH GFR
ALT: 28 U/L (ref 0–53)
AST: 24 U/L (ref 0–37)
Albumin: 4.5 g/dL (ref 3.5–5.2)
Alkaline Phosphatase: 56 U/L (ref 39–117)
BUN: 17 mg/dL (ref 6–23)
CO2: 29 meq/L (ref 19–32)
Calcium: 9.5 mg/dL (ref 8.4–10.5)
Chloride: 99 meq/L (ref 96–112)
Creatinine, Ser: 1.13 mg/dL (ref 0.40–1.50)
GFR: 75.19 mL/min (ref >60.00)
Glucose, Bld: 114 mg/dL — ABNORMAL HIGH (ref 70–99)
Potassium: 4.3 meq/L (ref 3.5–5.1)
Sodium: 135 meq/L (ref 135–145)
Total Bilirubin: 0.9 mg/dL (ref 0.2–1.2)
Total Protein: 7.6 g/dL (ref 6.0–8.3)

## 2023-09-14 LAB — CBC WITH DIFFERENTIAL/PLATELET
Basophils Absolute: 0 10*3/uL (ref 0.0–0.1)
Basophils Relative: 0.4 % (ref 0.0–3.0)
Eosinophils Absolute: 0.4 10*3/uL (ref 0.0–0.7)
Eosinophils Relative: 3.3 % (ref 0.0–5.0)
HCT: 47.5 % (ref 39.0–52.0)
Hemoglobin: 15.3 g/dL (ref 13.0–17.0)
Lymphocytes Relative: 7 % — ABNORMAL LOW (ref 12.0–46.0)
Lymphs Abs: 0.8 10*3/uL (ref 0.7–4.0)
MCHC: 32.1 g/dL (ref 30.0–36.0)
MCV: 83.9 fl (ref 78.0–100.0)
Monocytes Absolute: 0.9 10*3/uL (ref 0.1–1.0)
Monocytes Relative: 8.1 % (ref 3.0–12.0)
Neutro Abs: 8.9 10*3/uL — ABNORMAL HIGH (ref 1.4–7.7)
Neutrophils Relative %: 81.2 % — ABNORMAL HIGH (ref 43.0–77.0)
Platelets: 206 10*3/uL (ref 150.0–400.0)
RBC: 5.66 Mil/uL (ref 4.22–5.81)
RDW: 14.8 % (ref 11.5–15.5)
WBC: 11 10*3/uL — ABNORMAL HIGH (ref 4.0–10.5)

## 2023-09-14 LAB — PSA: PSA: 0.7 ng/mL (ref 0.10–4.00)

## 2023-09-14 LAB — LIPID PANEL
Cholesterol: 230 mg/dL — ABNORMAL HIGH (ref 0–200)
HDL: 50.3 mg/dL (ref >39.00)
LDL Cholesterol: 154 mg/dL — ABNORMAL HIGH (ref 0–99)
NonHDL: 179.73
Total CHOL/HDL Ratio: 5
Triglycerides: 129 mg/dL (ref 0.0–149.0)
VLDL: 25.8 mg/dL (ref 0.0–40.0)

## 2023-09-14 LAB — MICROALBUMIN / CREATININE URINE RATIO
Creatinine,U: 100.5 mg/dL
Microalb Creat Ratio: UNDETERMINED mg/g (ref 0.0–30.0)
Microalb, Ur: <0.7 mg/dL

## 2023-09-14 LAB — TSH: TSH: 1.87 u[IU]/mL (ref 0.35–5.50)

## 2023-09-17 ENCOUNTER — Other Ambulatory Visit: Payer: Self-pay | Admitting: Family Medicine

## 2023-12-16 ENCOUNTER — Other Ambulatory Visit: Payer: Self-pay | Admitting: Family Medicine

## 2024-02-14 ENCOUNTER — Ambulatory Visit: Payer: Self-pay

## 2024-02-14 NOTE — Telephone Encounter (Signed)
 FYI Only or Action Required?: FYI only for provider.  Patient was last seen in primary care on 09/13/2023 by McGowen, Aleene DEL, MD.  Called Nurse Triage reporting Dizziness.  Symptoms began 2 days ago.  Interventions attempted: Rest, hydration, or home remedies.  Symptoms are: unchanged.  Triage Disposition: See Physician Within 24 Hours  Patient/caregiver understands and will follow disposition?: Yes      Copied from CRM #[id number]. Topic: Clinical - Red Word Triage >> Feb 14, 2024  1:44 PM Suzen RAMAN wrote: Red Word that prompted transfer to Nurse Triage: dizziness x2 days.. requesting an appointment today or tomorrow if available       Reason for Disposition  [1] MODERATE dizziness (e.g., vertigo; feels very unsteady, interferes with normal activities) AND [2] has NOT been evaluated by doctor (or NP/PA) for this  Answer Assessment - Initial Assessment Questions 1. DESCRIPTION: Describe your dizziness.     Vertigo  2. VERTIGO: Do you feel like either you or the room is spinning or tilting?      Yes 3. LIGHTHEADED: Do you feel lightheaded? (e.g., somewhat faint, woozy, weak upon standing)     No 4. SEVERITY: How bad is it?  Can you walk?     Mild  5. ONSET:  When did the dizziness begin?     2 days ago 6. AGGRAVATING FACTORS: Does anything make it worse? (e.g., standing, change in head position)     Standing up and changing head position  7. CAUSE: What do you think is causing the dizziness?     Unsure  8. RECURRENT SYMPTOM: Have you had dizziness before? If Yes, ask: When was the last time? What happened that time?     Had similar symptoms 2 months ago  9. OTHER SYMPTOMS: Do you have any other symptoms? (e.g., earache, headache, numbness, tinnitus, vomiting, weakness)     Some discomfort to neck  Protocols used: Dizziness - Vertigo-A-AH

## 2024-02-15 ENCOUNTER — Ambulatory Visit (INDEPENDENT_AMBULATORY_CARE_PROVIDER_SITE_OTHER): Admitting: Family Medicine

## 2024-02-15 ENCOUNTER — Encounter: Payer: Self-pay | Admitting: Family Medicine

## 2024-02-15 VITALS — BP 125/85 | HR 58 | Temp 97.9°F | Ht 73.0 in | Wt 229.4 lb

## 2024-02-15 DIAGNOSIS — R7303 Prediabetes: Secondary | ICD-10-CM | POA: Diagnosis not present

## 2024-02-15 DIAGNOSIS — E7800 Pure hypercholesterolemia, unspecified: Secondary | ICD-10-CM

## 2024-02-15 DIAGNOSIS — H8110 Benign paroxysmal vertigo, unspecified ear: Secondary | ICD-10-CM | POA: Diagnosis not present

## 2024-02-15 LAB — LIPID PANEL
Cholesterol: 207 mg/dL — ABNORMAL HIGH (ref 0–200)
HDL: 38.5 mg/dL — ABNORMAL LOW (ref >39.00)
LDL Cholesterol: 123 mg/dL — ABNORMAL HIGH (ref 0–99)
NonHDL: 168.47
Total CHOL/HDL Ratio: 5
Triglycerides: 226 mg/dL — ABNORMAL HIGH (ref 0.0–149.0)
VLDL: 45.2 mg/dL — ABNORMAL HIGH (ref 0.0–40.0)

## 2024-02-15 LAB — COMPREHENSIVE METABOLIC PANEL WITH GFR
ALT: 26 U/L (ref 0–53)
AST: 22 U/L (ref 0–37)
Albumin: 4.3 g/dL (ref 3.5–5.2)
Alkaline Phosphatase: 49 U/L (ref 39–117)
BUN: 14 mg/dL (ref 6–23)
CO2: 30 meq/L (ref 19–32)
Calcium: 9.5 mg/dL (ref 8.4–10.5)
Chloride: 101 meq/L (ref 96–112)
Creatinine, Ser: 1.1 mg/dL (ref 0.40–1.50)
GFR: 77.42 mL/min (ref >60.00)
Glucose, Bld: 108 mg/dL — ABNORMAL HIGH (ref 70–99)
Potassium: 4.4 meq/L (ref 3.5–5.1)
Sodium: 138 meq/L (ref 135–145)
Total Bilirubin: 0.8 mg/dL (ref 0.2–1.2)
Total Protein: 7.3 g/dL (ref 6.0–8.3)

## 2024-02-15 LAB — HEMOGLOBIN A1C: Hgb A1c MFr Bld: 7.1 % — ABNORMAL HIGH (ref 4.6–6.5)

## 2024-02-15 MED ORDER — ATORVASTATIN CALCIUM 20 MG PO TABS: 20.0000 mg | ORAL_TABLET | Freq: Every day | ORAL | 1 refills | Status: AC

## 2024-02-15 MED ORDER — METFORMIN HCL ER 500 MG PO TB24: 500.0000 mg | ORAL_TABLET | Freq: Every day | ORAL | 1 refills | Status: AC

## 2024-02-15 NOTE — Progress Notes (Signed)
 OFFICE VISIT  02/15/2024  CC:  Chief Complaint  Patient presents with   Dizziness    X2 days; few episodes of vertigo; mainly occurs when looking down, discomfort in neck    Patient is a 53 y.o. male who presents for dizziness.  HPI: 3 days ago in the morning he got onset of a feeling of movement when he moves his head down and then looks up.  If he keeps his head very still the episodes do not occur.  These episodes last only a few seconds.  At the very beginning he did feel a sense of upset stomach without vomiting.  Yesterday he felt onset of some faint ringing in both ears.  No hearing deficit. He denies any spinning.  No focal weakness.  No recent headaches.  No tremor.  He has had trouble consistently keeping his weight down. No polyuria, polydipsia, or polyphagia.   Review of systems: No fever, no lower extremity swelling. He does have a bit of recurring stiffness of his neck and some pain in the distal cervical spine area just to the right of midline.   Past Medical History:  Diagnosis Date   Diabetes mellitus without complication (HCC)    Gout 2019/2020   History of adenomatous polyp of colon    Hypertriglyceridemia 2014    Past Surgical History:  Procedure Laterality Date   COLONOSCOPY  04/2020   adenoma x 1, recall 04/2025    Outpatient Medications Prior to Visit  Medication Sig Dispense Refill   albuterol  (VENTOLIN  HFA) 108 (90 Base) MCG/ACT inhaler Inhale 2 puffs into the lungs every 6 (six) hours as needed for wheezing or shortness of breath. 8 g 0   allopurinol  (ZYLOPRIM ) 100 MG tablet TAKE 1 TABLET DAILY 90 tablet 0   Continuous Glucose Sensor (FREESTYLE LIBRE 3 PLUS SENSOR) MISC Change sensor every 15 days. 2 each 11   Multiple Vitamins-Minerals (MULTIVITAMIN MEN) TABS Take by mouth daily.     pantoprazole  (PROTONIX ) 40 MG tablet Take 1 tablet (40 mg total) by mouth daily. (Patient not taking: Reported on 02/15/2024) 7 tablet 0   No facility-administered  medications prior to visit.    Allergies  Allergen Reactions   Cheese Nausea Only    Blue cheese    Review of Systems  As per HPI  PE:    02/15/2024    8:23 AM 09/13/2023    4:01 PM 09/06/2023    2:44 PM  Vitals with BMI  Height 6' 1  6' 1  Weight 229 lbs 6 oz 220 lbs 13 oz 220 lbs 10 oz  BMI 30.27 29.14 29.11  Systolic 125 131 875  Diastolic 85 77 80  Pulse 58 60 70   Physical Exam  General: Alert and well-appearing. Affect is pleasant, speech and thought are lucid. Neuro: CN 2-12 intact bilaterally, strength 5/5 in proximal and distal upper extremities and lower extremities bilaterally.  No sensory deficits.  No tremor.  No disdiadochokinesis.  No ataxia.  Upper extremity and lower extremity DTRs symmetric.  No pronator drift. Dix-Hallpike did not elicit any vertigo or disequilibrium or nystagmus.  LABS:  Last CBC Lab Results  Component Value Date   WBC 11.0 (H) 09/14/2023   HGB 15.3 09/14/2023   HCT 47.5 09/14/2023   MCV 83.9 09/14/2023   MCH 26.1 (L) 03/25/2018   RDW 14.8 09/14/2023   PLT 206.0 09/14/2023   Lab Results  Component Value Date   CHOL 230 (H) 09/14/2023   HDL 50.30  09/14/2023   LDLCALC 154 (H) 09/14/2023   TRIG 129.0 09/14/2023   CHOLHDL 5 09/14/2023    Last metabolic panel Lab Results  Component Value Date   GLUCOSE 114 (H) 09/14/2023   NA 135 09/14/2023   K 4.3 09/14/2023   CL 99 09/14/2023   CO2 29 09/14/2023   BUN 17 09/14/2023   CREATININE 1.13 09/14/2023   GFR 75.19 09/14/2023   CALCIUM  9.5 09/14/2023   PROT 7.6 09/14/2023   ALBUMIN 4.5 09/14/2023   BILITOT 0.9 09/14/2023   ALKPHOS 56 09/14/2023   AST 24 09/14/2023   ALT 28 09/14/2023   Last hemoglobin A1c Lab Results  Component Value Date   HGBA1C 6.2 (A) 09/06/2023   HGBA1C 6.2 09/06/2023   HGBA1C 6.2 09/06/2023   HGBA1C 6.2 09/06/2023   IMPRESSION AND PLAN:  #1 BPPV.  Seems to be extinguishing the last day or so. Reassured. Maneuvers discussed. I did not  read him in any medication.  2.  Borderline diabetes. He is in favor of starting metformin  now--> metformin  XR 500 mg once a day prescribed today. Monitor hemoglobin A1c and fasting glucose today.  3.  Hypercholesterolemia. Would like his LDL to be less than 70. LDL was 154 in April of this year. Monitor lipid panel and hepatic panel today. Start a atorvastatin  20 mg a day today.  An After Visit Summary was printed and given to the patient.  FOLLOW UP: Return in about 3 months (around 05/16/2024) for routine chronic illness f/u.  Signed:  Gerlene Hockey, MD           02/15/2024

## 2024-02-19 ENCOUNTER — Encounter: Payer: Self-pay | Admitting: Family Medicine

## 2024-02-19 ENCOUNTER — Ambulatory Visit: Payer: Self-pay | Admitting: Family Medicine

## 2024-04-14 ENCOUNTER — Ambulatory Visit: Admitting: Family Medicine

## 2024-04-14 ENCOUNTER — Ambulatory Visit (INDEPENDENT_AMBULATORY_CARE_PROVIDER_SITE_OTHER): Admitting: Family Medicine

## 2024-04-14 ENCOUNTER — Encounter: Payer: Self-pay | Admitting: Family Medicine

## 2024-04-14 VITALS — BP 118/60 | HR 67 | Temp 98.1°F | Ht 73.0 in | Wt 229.0 lb

## 2024-04-14 DIAGNOSIS — F419 Anxiety disorder, unspecified: Secondary | ICD-10-CM | POA: Diagnosis not present

## 2024-04-14 MED ORDER — PROPRANOLOL HCL 10 MG PO TABS: 10.0000 mg | ORAL_TABLET | Freq: Three times a day (TID) | ORAL | 3 refills | Status: AC | PRN

## 2024-04-14 MED ORDER — ESCITALOPRAM OXALATE 5 MG PO TABS
5.0000 mg | ORAL_TABLET | Freq: Every day | ORAL | 3 refills | Status: AC
Start: 2024-04-14 — End: ?

## 2024-04-14 NOTE — Progress Notes (Unsigned)
   Acute Office Visit  Subjective:     Patient ID: Austin Sanchez, male    DOB: 1970/11/02, 53 y.o.   MRN: [id number]  Chief Complaint  Patient presents with   Anxiety    Patient complains of increased anxiety, questioned if due to role changing in his job, heart rate increased during talks, wakes him up during the night    Anxiety    Discussed the use of AI scribe software for clinical note transcription with the patient, who gave verbal consent to proceed.  History of Present Illness      Review of Systems  All other systems reviewed and are negative.       Objective:    BP 118/60   Pulse 67   Temp 98.1 F (36.7 C) (Oral)   Ht 6' 1 (1.854 m)   Wt 229 lb (103.9 kg)   SpO2 98%   BMI 30.21 kg/m     Physical Exam Vitals reviewed.  Constitutional:      Appearance: Normal appearance. He is normal weight.  Neurological:     Mental Status: He is alert.     No results found for any visits on 04/14/24.      Assessment & Plan:   Problem List Items Addressed This Visit   None Visit Diagnoses       Anxiety    -  Primary   Relevant Medications   escitalopram  (LEXAPRO ) 5 MG tablet   propranolol  (INDERAL ) 10 MG tablet       Meds ordered this encounter  Medications   escitalopram  (LEXAPRO ) 5 MG tablet    Sig: Take 1 tablet (5 mg total) by mouth daily.    Dispense:  30 tablet    Refill:  3   propranolol  (INDERAL ) 10 MG tablet    Sig: Take 1 tablet (10 mg total) by mouth 3 (three) times daily as needed.    Dispense:  30 tablet    Refill:  3    Return in about 2 months (around 06/14/2024) for follow up with Dr. McGowen.  Heron CHRISTELLA Sharper, MD

## 2024-04-20 ENCOUNTER — Other Ambulatory Visit: Payer: Self-pay | Admitting: Family Medicine

## 2024-05-12 ENCOUNTER — Other Ambulatory Visit: Payer: Self-pay | Admitting: Family Medicine
